# Patient Record
Sex: Female | Born: 1975
Health system: Southern US, Community
[De-identification: ages and names within clinical notes are randomized; demographics above are authoritative.]

## PROBLEM LIST (undated history)

## (undated) DIAGNOSIS — O24419 Gestational diabetes mellitus in pregnancy, unspecified control: Secondary | ICD-10-CM

## (undated) DIAGNOSIS — E669 Obesity, unspecified: Secondary | ICD-10-CM

## (undated) DIAGNOSIS — Z8632 Personal history of gestational diabetes: Secondary | ICD-10-CM

## (undated) HISTORY — PX: BACK SURGERY: SHX140

## (undated) HISTORY — PX: NO PAST SURGERIES: SHX2092

## (undated) HISTORY — DX: Obesity, unspecified: E66.9

## (undated) HISTORY — DX: Gestational diabetes mellitus in pregnancy, unspecified control: O24.419

## (undated) HISTORY — DX: Personal history of gestational diabetes: Z86.32

## (undated) NOTE — *Deleted (*Deleted)
Pt presents with cough and shortness of shortness of breath x 13 days. Pt repots tested positive for COVID on 10/03/20. Pt denies chest pain

---

## 1999-04-29 ENCOUNTER — Emergency Department (HOSPITAL_COMMUNITY): Admission: EM | Admit: 1999-04-29 | Discharge: 1999-04-30 | Payer: Self-pay | Admitting: Emergency Medicine

## 1999-04-29 ENCOUNTER — Encounter: Payer: Self-pay | Admitting: Emergency Medicine

## 2000-08-17 ENCOUNTER — Ambulatory Visit (HOSPITAL_COMMUNITY): Admission: RE | Admit: 2000-08-17 | Discharge: 2000-08-17 | Payer: Self-pay | Admitting: *Deleted

## 2000-11-24 ENCOUNTER — Inpatient Hospital Stay (HOSPITAL_COMMUNITY): Admission: RE | Admit: 2000-11-24 | Discharge: 2000-11-24 | Payer: Self-pay | Admitting: Obstetrics & Gynecology

## 2000-12-01 ENCOUNTER — Encounter (HOSPITAL_COMMUNITY): Admission: RE | Admit: 2000-12-01 | Discharge: 2001-03-01 | Payer: Self-pay | Admitting: *Deleted

## 2001-02-22 ENCOUNTER — Inpatient Hospital Stay (HOSPITAL_COMMUNITY): Admission: AD | Admit: 2001-02-22 | Discharge: 2001-02-22 | Payer: Self-pay | Admitting: Obstetrics & Gynecology

## 2006-10-31 ENCOUNTER — Inpatient Hospital Stay (HOSPITAL_COMMUNITY): Admission: AD | Admit: 2006-10-31 | Discharge: 2006-10-31 | Payer: Self-pay | Admitting: Obstetrics

## 2007-04-22 ENCOUNTER — Inpatient Hospital Stay (HOSPITAL_COMMUNITY): Admission: AD | Admit: 2007-04-22 | Discharge: 2007-04-24 | Payer: Self-pay | Admitting: Obstetrics

## 2008-05-02 ENCOUNTER — Encounter (INDEPENDENT_AMBULATORY_CARE_PROVIDER_SITE_OTHER): Payer: Self-pay | Admitting: Family Medicine

## 2008-05-02 ENCOUNTER — Ambulatory Visit: Payer: Self-pay | Admitting: Internal Medicine

## 2008-05-02 LAB — CONVERTED CEMR LAB
ALT: 40 units/L — ABNORMAL HIGH (ref 0–35)
AST: 35 units/L (ref 0–37)
Albumin: 4.1 g/dL (ref 3.5–5.2)
Alkaline Phosphatase: 69 units/L (ref 39–117)
BUN: 9 mg/dL (ref 6–23)
Basophils Absolute: 0 10*3/uL (ref 0.0–0.1)
Basophils Relative: 0 % (ref 0–1)
CO2: 22 meq/L (ref 19–32)
Calcium: 8.8 mg/dL (ref 8.4–10.5)
Chloride: 106 meq/L (ref 96–112)
Cholesterol: 153 mg/dL (ref 0–200)
Creatinine, Ser: 0.39 mg/dL — ABNORMAL LOW (ref 0.40–1.20)
Eosinophils Absolute: 0.2 10*3/uL (ref 0.0–0.7)
Eosinophils Relative: 2 % (ref 0–5)
Glucose, Bld: 98 mg/dL (ref 70–99)
HCT: 39.1 % (ref 36.0–46.0)
HDL: 59 mg/dL (ref >39)
Hemoglobin: 12.9 g/dL (ref 12.0–15.0)
LDL Cholesterol: 67 mg/dL (ref 0–99)
Lymphocytes Relative: 36 % (ref 12–46)
Lymphs Abs: 2.8 10*3/uL (ref 0.7–4.0)
MCHC: 33 g/dL (ref 30.0–36.0)
MCV: 90.3 fL (ref 78.0–100.0)
Monocytes Absolute: 0.6 10*3/uL (ref 0.1–1.0)
Monocytes Relative: 8 % (ref 3–12)
Neutro Abs: 4.3 10*3/uL (ref 1.7–7.7)
Neutrophils Relative %: 54 % (ref 43–77)
Platelets: 244 10*3/uL (ref 150–400)
Potassium: 3.9 meq/L (ref 3.5–5.3)
RBC: 4.33 M/uL (ref 3.87–5.11)
RDW: 13.4 % (ref 11.5–15.5)
Sodium: 138 meq/L (ref 135–145)
Total Bilirubin: 0.3 mg/dL (ref 0.3–1.2)
Total CHOL/HDL Ratio: 2.6
Total Protein: 7.4 g/dL (ref 6.0–8.3)
Triglycerides: 136 mg/dL (ref <150)
VLDL: 27 mg/dL (ref 0–40)
WBC: 7.9 10*3/uL (ref 4.0–10.5)

## 2008-09-11 ENCOUNTER — Ambulatory Visit: Payer: Self-pay | Admitting: Internal Medicine

## 2008-10-30 ENCOUNTER — Inpatient Hospital Stay (HOSPITAL_COMMUNITY): Admission: AD | Admit: 2008-10-30 | Discharge: 2008-11-01 | Payer: Self-pay | Admitting: Obstetrics

## 2009-02-09 ENCOUNTER — Encounter (INDEPENDENT_AMBULATORY_CARE_PROVIDER_SITE_OTHER): Payer: Self-pay | Admitting: Adult Health

## 2009-02-09 LAB — CONVERTED CEMR LAB
ALT: 31 units/L (ref 0–35)
Basophils Relative: 0 % (ref 0–1)
CO2: 19 meq/L (ref 19–32)
Chloride: 105 meq/L (ref 96–112)
Cholesterol: 145 mg/dL (ref 0–200)
GC Probe Amp, Genital: NEGATIVE
Hemoglobin: 13 g/dL (ref 12.0–15.0)
LDL Cholesterol: 77 mg/dL (ref 0–99)
Lymphocytes Relative: 43 % (ref 12–46)
Lymphs Abs: 4.1 10*3/uL — ABNORMAL HIGH (ref 0.7–4.0)
Monocytes Relative: 6 % (ref 3–12)
Neutro Abs: 4.5 10*3/uL (ref 1.7–7.7)
Neutrophils Relative %: 48 % (ref 43–77)
Potassium: 4 meq/L (ref 3.5–5.3)
Preg, Serum: NEGATIVE
RBC: 4.38 M/uL (ref 3.87–5.11)
Sodium: 141 meq/L (ref 135–145)
Total Bilirubin: 0.3 mg/dL (ref 0.3–1.2)
Total Protein: 7.3 g/dL (ref 6.0–8.3)
VLDL: 24 mg/dL (ref 0–40)
WBC: 9.5 10*3/uL (ref 4.0–10.5)

## 2009-05-01 ENCOUNTER — Ambulatory Visit: Payer: Self-pay | Admitting: Internal Medicine

## 2009-07-23 ENCOUNTER — Ambulatory Visit: Payer: Self-pay | Admitting: Internal Medicine

## 2009-10-14 ENCOUNTER — Ambulatory Visit: Payer: Self-pay | Admitting: Internal Medicine

## 2010-01-04 ENCOUNTER — Ambulatory Visit: Payer: Self-pay | Admitting: Internal Medicine

## 2011-04-19 NOTE — H&P (Signed)
NAMEMarland Kitchen  ANSLIE, SPADAFORA NO.:  [id number]   MEDICAL RECORD NO.:  [id number]          PATIENT TYPE:  INP   LOCATION:  9162                          FACILITY:  [facility]   PHYSICIAN:  Roseanna Rainbow, M.D.DATE OF BIRTH:  [birth year]   DATE OF ADMISSION:  04/22/2007  DATE OF DISCHARGE:                              HISTORY & PHYSICAL   CHIEF COMPLAINT:  The patient is a 35 year old para 2 with an estimated  date of confinement of Apr 20, 2007, with an intrauterine pregnancy at  40+ weeks complaining of uterine contractions.   HISTORY AND PHYSICAL:  Please see the above.   SOCIAL HISTORY:  She is single.  Denies any tobacco, ethanol, or drug  use.   ALLERGIES:  NO KNOWN DRUG ALLERGIES.   PAST GYNECOLOGIC HISTORY:  Noncontributory.   PAST OBSTETRICAL HISTORY:  In 1997 she was delivered of a liveborn female  7 pounds, full term, spontaneous vaginal delivery, no complications.  In  2001 she was delivered of a 9-pound female, full term, spontaneous  vaginal delivery, no complications.   PAST MEDICAL HISTORY:  She denies past surgical history.   ANTEPARTUM COURSE:  Prenatal care with Dr. Gaynell Face with onset of care  at 16 weeks.   OBSTETRIC RISK FACTORS:  History of a previous large-for-gestational-age  infant.  Placenta previa that resolved.  Urinary tract infection.   PRENATAL SCREENS:  Hemoglobin 13, hematocrit 38, platelets 244,000,  blood type O positive, antibody screen negative, RPR nonreactive,  rubella immune, hepatitis B surface antigen negative, HIV testing  declined, PPD  negative.  Pap smear test negative, GC probe negative,  Chlamydia probe negative.  Quad screen declined.  Three-hour GTT normal.  GBS negative on March 30, 2007.  Ultrasound demonstrated a complete  previa with marginal abruption on October 31, 2006, and that ultrasound  gave her an [facility] of Apr 20, 2007.  Ultrasound for placentation at 35  weeks 3 days demonstrated a posterior  placenta.   PHYSICAL EXAMINATION:  VITAL SIGNS:  Stable, afebrile.  GENERAL:  Well developed, well nourished, no apparent distress.  ABDOMEN:  Gravid.  STERILE VAGINAL EXAM:  The cervix is 6-7 cm dilated, the attitude is  left occiput anterior, no molding or caput, vertex is at a -1 station,  bulging bag of water was ruptured for clear fluid.  Fetal heart tracing  reassuring.   TOCODYNAMOMETER:  Uterine contractions every 2-5 minutes.   ASSESSMENT:  Multipara at term, active labor, fetal heart tracing  consistent with fetal well being.   PLAN:  Admission.  Expectant management.      Roseanna Rainbow, M.D.  Electronically Signed     LAJ/MEDQ  D:  04/22/2007  T:  04/22/2007  Job:  [id number]

## 2011-09-06 LAB — CBC
HCT: 34.7 — ABNORMAL LOW
Hemoglobin: 12
MCHC: 34.6
RDW: 13.5

## 2011-09-12 ENCOUNTER — Other Ambulatory Visit: Payer: Self-pay | Admitting: Family Medicine

## 2011-09-12 DIAGNOSIS — Z3689 Encounter for other specified antenatal screening: Secondary | ICD-10-CM

## 2011-09-12 LAB — GC/CHLAMYDIA PROBE AMP, GENITAL: Gonorrhea: NEGATIVE

## 2011-09-12 LAB — ABO/RH: RH Type: POSITIVE

## 2011-09-12 LAB — CBC
HCT: 37 % (ref 36–46)
Hemoglobin: 12.7 g/dL (ref 12.0–16.0)
Platelets: 184 10*3/uL (ref 150–399)
Platelets: 184 10*3/uL (ref 150–399)

## 2011-09-12 LAB — HEPATITIS B SURFACE ANTIGEN: Hepatitis B Surface Ag: NEGATIVE

## 2011-09-12 LAB — HIV ANTIBODY (ROUTINE TESTING W REFLEX): HIV: NONREACTIVE

## 2011-09-12 LAB — RUBELLA ANTIBODY, IGM: Rubella: IMMUNE

## 2011-09-12 LAB — RPR: RPR: NONREACTIVE

## 2011-09-22 ENCOUNTER — Ambulatory Visit (HOSPITAL_COMMUNITY)
Admission: RE | Admit: 2011-09-22 | Discharge: 2011-09-22 | Disposition: A | Discharge status: Home / Self Care | Payer: Medicaid Other | Source: Ambulatory Visit | Attending: Family Medicine | Admitting: Family Medicine

## 2011-09-22 DIAGNOSIS — Z1389 Encounter for screening for other disorder: Secondary | ICD-10-CM | POA: Insufficient documentation

## 2011-09-22 DIAGNOSIS — Z3689 Encounter for other specified antenatal screening: Secondary | ICD-10-CM

## 2011-09-22 DIAGNOSIS — E669 Obesity, unspecified: Secondary | ICD-10-CM | POA: Insufficient documentation

## 2011-09-22 DIAGNOSIS — O358XX0 Maternal care for other (suspected) fetal abnormality and damage, not applicable or unspecified: Secondary | ICD-10-CM | POA: Insufficient documentation

## 2011-09-22 DIAGNOSIS — Z363 Encounter for antenatal screening for malformations: Secondary | ICD-10-CM | POA: Insufficient documentation

## 2011-09-23 ENCOUNTER — Other Ambulatory Visit: Payer: Self-pay | Admitting: Family Medicine

## 2011-09-23 DIAGNOSIS — Z3689 Encounter for other specified antenatal screening: Secondary | ICD-10-CM

## 2011-10-06 ENCOUNTER — Ambulatory Visit (HOSPITAL_COMMUNITY): Payer: Self-pay

## 2011-10-06 ENCOUNTER — Ambulatory Visit (HOSPITAL_COMMUNITY)
Admission: RE | Admit: 2011-10-06 | Discharge: 2011-10-06 | Disposition: A | Discharge status: Home / Self Care | Payer: Medicaid Other | Source: Ambulatory Visit | Attending: Family Medicine | Admitting: Family Medicine

## 2011-10-06 DIAGNOSIS — O99210 Obesity complicating pregnancy, unspecified trimester: Secondary | ICD-10-CM | POA: Insufficient documentation

## 2011-10-06 DIAGNOSIS — Z3689 Encounter for other specified antenatal screening: Secondary | ICD-10-CM

## 2011-10-06 DIAGNOSIS — E669 Obesity, unspecified: Secondary | ICD-10-CM | POA: Insufficient documentation

## 2011-12-06 NOTE — L&D Delivery Note (Signed)
Delivery Note At 3:56 AM a viable female was delivered via Vaginal, Spontaneous Delivery (Presentation: ; Occiput Anterior).  APGAR: 9, 9; weight .   Placenta status: Intact, Spontaneous.  Cord: 3 vessels with the following complications: None.    Anesthesia: None  Episiotomy:  Lacerations:  Suture Repair: none Est. Blood Loss (mL): 350   Mom to postpartum.  Baby to nursery-stable.  CRESENZO-DISHMAN,Tracyann Duffell 02/23/2012, 4:15 AM

## 2011-12-19 LAB — CBC: Hemoglobin: 12.1 g/dL (ref 12.0–16.0)

## 2011-12-19 LAB — HIV ANTIBODY (ROUTINE TESTING W REFLEX): HIV: NONREACTIVE

## 2011-12-19 LAB — GLUCOSE TOLERANCE, 1 HOUR: GTT, 1 hr: 153

## 2011-12-23 LAB — GLUCOSE TOLERANCE, 3 HOURS
Glucose, GTT - 2 Hour: 203 mg/dL — AB (ref ?–140)
Glucose, GTT - 3 Hour: 113 mg/dL (ref ?–140)
Glucose, GTT - Fasting: 83 mg/dL (ref 80–110)

## 2011-12-28 ENCOUNTER — Other Ambulatory Visit (HOSPITAL_COMMUNITY): Payer: Self-pay | Admitting: Physician Assistant

## 2011-12-28 DIAGNOSIS — O99210 Obesity complicating pregnancy, unspecified trimester: Secondary | ICD-10-CM

## 2011-12-28 DIAGNOSIS — Z3689 Encounter for other specified antenatal screening: Secondary | ICD-10-CM

## 2011-12-29 ENCOUNTER — Ambulatory Visit (HOSPITAL_COMMUNITY)
Admission: RE | Admit: 2011-12-29 | Discharge: 2011-12-29 | Disposition: A | Discharge status: Home / Self Care | Payer: Medicaid Other | Source: Ambulatory Visit | Attending: Physician Assistant | Admitting: Physician Assistant

## 2011-12-29 DIAGNOSIS — O99810 Abnormal glucose complicating pregnancy: Secondary | ICD-10-CM | POA: Insufficient documentation

## 2011-12-29 DIAGNOSIS — Z3689 Encounter for other specified antenatal screening: Secondary | ICD-10-CM | POA: Insufficient documentation

## 2011-12-29 DIAGNOSIS — O99210 Obesity complicating pregnancy, unspecified trimester: Secondary | ICD-10-CM

## 2012-01-02 ENCOUNTER — Ambulatory Visit (INDEPENDENT_AMBULATORY_CARE_PROVIDER_SITE_OTHER): Payer: Self-pay | Admitting: Family Medicine

## 2012-01-02 ENCOUNTER — Encounter: Payer: Self-pay | Attending: Obstetrics and Gynecology | Admitting: Dietician

## 2012-01-02 DIAGNOSIS — O0990 Supervision of high risk pregnancy, unspecified, unspecified trimester: Secondary | ICD-10-CM

## 2012-01-02 DIAGNOSIS — E669 Obesity, unspecified: Secondary | ICD-10-CM

## 2012-01-02 DIAGNOSIS — O99810 Abnormal glucose complicating pregnancy: Secondary | ICD-10-CM

## 2012-01-02 DIAGNOSIS — O24419 Gestational diabetes mellitus in pregnancy, unspecified control: Secondary | ICD-10-CM | POA: Insufficient documentation

## 2012-01-02 DIAGNOSIS — Z713 Dietary counseling and surveillance: Secondary | ICD-10-CM | POA: Insufficient documentation

## 2012-01-02 DIAGNOSIS — O99210 Obesity complicating pregnancy, unspecified trimester: Secondary | ICD-10-CM

## 2012-01-02 LAB — POCT URINALYSIS DIP (DEVICE)
Glucose, UA: NEGATIVE mg/dL
Leukocytes, UA: NEGATIVE
Nitrite: NEGATIVE
pH: 6 (ref 5.0–8.0)

## 2012-01-02 NOTE — Progress Notes (Signed)
Diabetes Education:  Completed review of the diet and provided meter instructions, with the assistance of Spanish interpreter, Raynelle Fanning.  Provided diet and GDM teaching materials in Spanish.  Nutricion Diabetes y [place],, Conteo de carbos y planificaci8on de comidas. Provided True Track Meter, Lot: [id number] Expiration: 2013/05/04 Lancets (50) Lot: [id number] Expiration: 2016/03/05 Strips (50) Lot: [id number] EXpiration: 2014/04/02.  She completed a return demonstration of the meter.  Glucose fasting at 10:38 was 98 mg/dl.  She will check fasting and two hours post-first bite of each meal and record her glucose levels.  She was instructed to bring her meter and her glucose log to all clinic appointments.  Maggie Pauline Pegues, RN, RD, CDE

## 2012-01-02 NOTE — Progress Notes (Signed)
Some swelling in hands Used interpreter Delorise Royals.

## 2012-01-02 NOTE — Progress Notes (Signed)
Risks of Diabetes reviewed.  To see Diabetic educator today. U/S 1/25-frank breech, 4 lb 5 oz 88%, AC ahead, AFI-13.

## 2012-01-02 NOTE — Patient Instructions (Signed)
Diabetes mellitus gestacional (Gestational Diabetes Mellitus) La diabetes mellitus gestacional se produce slo durante el embarazo. Aparece cuando el organismo no puede controlar adecuadamente la glucosa (azcar) que aumenta en la sangre despus de comer. Durante el embarazo, se produce una resistencia a la insulina (sensibilidad reducida a la insulina) debido a la liberacin de hormonas por parte de la placenta. Generalmente, el pncreas de una mujer embarazada produce la cantidad suficiente de insulina para vencer esa resistencia. Sin embargo, en la diabetes gestacional, hay insulina pero no cumple su funcin adecuadamente. Si la resistencia es lo suficientemente grave como para que el pncreas no produzca la cantidad de insulina suficiente, la glucosa extra se acumula en la sangre.  QUINES TIENEN RIESGO DE DESARROLLAR DIABETES GESTACIONAL?  Las mujeres con historia de diabetes en la familia.   Las mujeres de ms de 25 aos.   Las que presentan sobrepeso.   Las mujeres que pertenecen a ciertos grupos tnicos (latinas, afroamericanas, norteamericanas nativas, asiticas y las originarias de las islas del [place].  QUE PUEDE OCURRIRLE AL BEB? Si el nivel de glucosa en sangre de la madre es demasiado elevado mientras este embarazada, el nivel extra de azcar pasar por el cordn umbilical hacia el beb. Algunos de los problemas del beb pueden ser:  Beb demasiado grande: si el nio recibe demasiada azcar, puede aumentar mucho de peso. Esto puede hacer que sea demasiado grande para nacer por parto normal (vaginal) por lo que ser necesario realizar una cesrea.   Bajo nivel de glucosa (hipoglucemia): el beb produce insulina extra en respuesta a la excesiva cantidad de azcar que obtiene de la madre. Cuando el beb nace y ya no necesita insulina extra, su nivel de azcar en sangre puede disminuir.   Ictericia (coloracin amarillenta de la piel y los ojos): esto es bastante frecuente en los  bebs. La causa es la acumulacin de una sustancia qumica denominada bilirrubina. No siempre es un trastorno grave, pero se observa con frecuencia en los bebs cuyas madres sufren diabetes gestacional.  RIESGOS PARA LA MADRE Las mujeres que han sufrido diabetes gestacional pueden tener ms riesgos para algunos problemas como:  Preeclampsia o toxemia, incluyendo problemas con hipertensin arterial. La presin arterial y los niveles de protenas en la orina deben controlarse con frecuencia.   Infecciones   Parto por cesrea.   Aparicin de diabetes tipo 2 en una etapa posterior de la vida. Alrededor del 30% al 50% sufrir diabetes posteriormente, especialmente las que son obesas.  DIAGNSTICO Las hormonas que causan resistencia a la insulina tienen su mayor nivel alrededor de las 24 a 28 semanas del embarazo. Si se experimentan sntomas, stos son similares a los sntomas que normalmente aparecen durante el embarazo.  La diabetes mellitus gestacional generalmente se diagnostica por medio de un mtodo en dos partes: 1. Despus de la 24 a 28 semanas de embarazo, la mujer debe beber una solucin que contiene glucosa y realizar un anlisis de sangre. Si el nivel de glucosa es elevado, la realizarn un segundo anlisis.  2. La prueba oral de tolerancia a la glucosa, que dura aproximadamente tres horas. Despus de realizar ayuno durante la noche, se controla nivel de glucosa en sangre. La mujer bebe una solucin que contiene glucosa y le realizan anlisis de glucosa en sangre cada hora.  Si la mujer tiene factores de riesgos para la diabetes mellitus gestacional, el mdico podr indicar el anlisis antes de las 24 semanas de embarazo. TRATAMIENTO El tratamiento est dirigido a mantener la glucosa en   sangre de la madre en un nivel normal y puede incluir:  La planificacin de los alimentos.   Recibir insulina u otro medicamento para controlar el nivel de glucosa en sangre.   La prctica de ejercicios.    Llevar un registro diario de los alimentos que consume.   Control y registro de los niveles de glucosa en sangre.   Control de los niveles de cetona en la orina, aunque esto ya no se considera necesario en la mayora de los embarazos.  INSTRUCCIONES PARA EL CUIDADO DOMICILIARIO Mientras est embarazada:  Siga los consejos de su mdico relacionados con los controles prenatales, la planificacin de la comida, la actividad fsica, los medicamentos, vitaminas, los anlisis de sangre y otras pruebas y las actividades fsicas.   Lleve un registro de las comidas, las pruebas de glucosa en sangre y la cantidad de insulina que recibe (si corresponde). Muestre todo al profesional en cada consulta mdica prenatal.   Si sufre diabetes mellitus gestacional, podr tener problemas de hipoglucemia (nivel bajo de glucosa en sangre). Podr sospechar este problema si se siente repentinamente mareada, tiene temblores y/o se siente dbil. Si cree que esto le est ocurriendo, y tiene un medidor de glucosa, mida su nivel de glucosa en sangre. Siga los consejos de su mdico sobre el modo y el momento de tratar su nivel de glucosa en sangre. Generalmente se sigue la regla 15:15 Consuma 15 g de hidratos de carbono, espere 15 minutos y vuelva controlar el nivel de glucosa en sangre.. Ejemplos de 15 g de hidratos de carbono son:   1 taza de leche descremada.    taza de jugo.   3-4 tabletas de glucosa.   5-6 caramelos duros.   1 caja pequea de pasas de uva.    taza de gaseosa comn.   Mantenga una buena higiene para evitar infecciones.   No fume.  SOLICITE ATENCIN MDICA SI:  Observa prdida vaginal con o sin picazn.   Se siente ms dbil o cansada que lo habitual.   Transpira mucho.   Tiene un aumento de peso repentino, 2,5 kg o ms en una semana.   Pierde peso, 1.5 kg o ms en una semana.   Su nivel de glucosa en sangre es elevado, necesita instrucciones.  SOLICITE ATENCIN MDICA DE  INMEDIATO SI:  Sufre una cefalea intensa.   Se marea o pierde el conocimiento   Presenta nuseas o vmitos.   Se siente desorientada confundida.   Sufre convulsiones.   Tiene problemas de visin.   Siente dolor en el estmago.   Presenta una hemorragia vaginal abundante.   Tiene contracciones uterinas.   Tiene una prdida importante de lquido por la vagina  DESPUS QUE NACE EL BEB:  Concurra a todos los controles de seguimiento y realice los anlisis de sangre segn las indicaciones de su mdico.   Mantenga un estilo de vida saludable para evitar la diabetes en el futuro. Aqu se incluye:   Siga el plan de alimentacin saludable.   Controle su peso.   Practique actividad fsica y descanse lo necesario.   No fume.   Amamante a su beb mientras pueda. Esto disminuir la probabilidad de que usted y su beb sufran diabetes posteriormente.  Para ms informacin acerca de la diabetes, visite la pgina web de la American Diabetes Association: www.americandiabetesassociation.org. Para ms informacin acerca de la diabetes gestacional cite la pgina web del American [employer]nec[employer] en: www.acog.org. Document Released: 08/31/2005 Document Revised: 08/03/2011 [employer] Patient Information 2012   [employer]. Embarazo - Tercer trimestre (Pregnancy - Third Trimester) El tercer trimestre del embarazo (los ltimos 3 meses) es el perodo de cambios ms rpidos que atraviesan usted y el beb. El aumento de peso es ms rpido. El beb alcanza un largo de aproximadamente 50 cm (20 pulgadas) y pesa entre 2,700 y 4,500 kg (6 a 10 libras). El beb gana ms tejido graso y ya est listo para la vida fuera del cuerpo de la madre. Mientras estn en el interior, los bebs tienen perodos de sueo y vigilia, succionan el pulgar y tienen hipo. Quizs sienta pequeas contracciones del tero. Este es el falso trabajo de parto. Tambin se las conoce como contracciones de  Braxton-Hicks. Es como una prctica del parto. Los problemas ms habituales de esta etapa del embarazo incluyen mayor dificultad para respirar, hinchazn de las manos y los pies por retencin de lquidos y la necesidad de orinar con ms frecuencia debido a que el tero y el beb presionan sobre la vejiga.  EXAMENES PRENATALES  Durante los exmenes prenatales, deber seguir realizando pruebas de sangre, segn avance el embarazo. Estas pruebas se realizan para controlar su salud y la del beb. Tambin se realizan anlisis de sangre para conocer los niveles de hemoglobina. La anemia (bajo nivel de hemoglobina) es frecuente durante el embarazo. Para prevenirla, se administran hierro y vitaminas. Tambin le harn nuevas pruebas para descartar la diabetes. Podrn repetirle algunas de las pruebas que le hicieron previamente.   En cada visita le medirn el tamao del tero. Es para asegurarse de que el beb se desarrolla correctamente.   Tambin en cada visita la pesarn. Esto se realiza para asegurarse de que aumenta de peso al ritmo indicado y que usted y su beb evolucionan normalmente.   En algunas ocasiones se realiza una ecografa para confirmar el correcto desarrollo y evolucin del beb. Esta prueba se realiza con ondas sonoras inofensivas para el beb, de modo que el profesional pueda calcular con ms precisin la fecha del parto.   Discuta las posibilidades de la anestesia si necesita cesrea.  Algunas veces se realizan pruebas especializadas del lquido amnitico que rodea al beb. Esta prueba se denomina amniocentesis. El lquido amnitico se obtiene introduciendo una aguja en el abdomen (vientre). En ocasiones se lleva a cabo cerca del final del embarazo, si es necesario adelantar el parto. En este caso se realiza para asegurarse de que los pulmones del beb estn lo suficientemente maduros como para que pueda vivir fuera del tero. CAMBIOS QUE OCURREN EN EL TERCER TRIMESTRE DEL EMBARAZO Su  organismo atravesar diferentes cambios durante el embarazo que varan de una persona a otra. Converse con el profesional que la asiste acerca los cambios que usted note y que la preocupen.  Durante el ltimo trimestre probablemente sienta un aumento del apetito. Es normal tener "antojos" de ciertas comidas. Esto vara de una persona a otra y de un embarazo a otro.   Podrn aparecer las primeras estras en las caderas, abdomen y mamas. Estos son cambios normales del cuerpo durante el embarazo. No existen medicamentos ni ejercicios que puedan prevenir estos cambios.   El estreimiento puede tratarse con un laxante o agregando fibra a su dieta. Beber grandes cantidades de lquidos, tomar fibras en forma de verduras, frutas y granos integrales es de gran ayuda.   Tambin es beneficioso practicar actividad fsica. Si ha sido una persona activa hasta el embarazo, podr continuar con la mayora de las actividades durante el mismo. Si ha sido menos activa,   puede ser beneficioso que comience con un programa de ejercicios, como realizar caminatas. Consulte con el profesional que la asiste antes de comenzar un programa de ejercicios.   Evite el consumo de cigarrillos, el alcohol, los medicamentos no prescritos y las "drogas de la calle" durante el embarazo. Estas sustancias qumicas afectan la formacin y el desarrollo del beb. Evite estas sustancias durante todo el embarazo para asegurar el nacimiento de un beb sano.   Dolor de espalda, venas varicosas y hemorroides podran aparecer o empeorar.   Los movimientos del beb pueden ser ms bruscos y aparecer ms a menudo.   Puede que note dificultades para respirar facilmente.   El ombligo podra salrsele hacia afuera.   Puede segregar un lquido amarillento (calostro) de las mamas.   Puede segregar mucus con sangre. Esto normalmente ocurre unos pocos das a una semana antes de que comience el trabajo de parto.  INSTRUCCIONES PARA EL CUIDADO  DOMICILIARIO  La mayor parte de los cuidados que se aconsejan son los mismos que los indicados para las primeras etapas del embarazo. Es importante que concurra a todas las citas con el profesional y siga sus instrucciones con respecto a los medicamentos que deba utilizar, a la actividad fsica y a la dieta.   Durante el embarazo debe obtener nutrientes para usted y para su beb. Consuma alimentos balanceados a intervalos regulares. Elija alimentos como carne, pescado, leche y otros productos lcteos descremados, verduras, frutas, panes integrales y cereales. El profesional le informar cul es el aumento de peso ideal.   Las relaciones sexuales pueden continuarse hasta casi el final del embarazo, si no se presentan otros problemas como prdida prematura (antes de tiempo) de lquido amnitico, hemorragia vaginal o dolor abdominal (en el vientre).   Realice actividad fsica todos los das, si no tiene restricciones. Consulte con el profesional que la asiste si no sabe con certeza si determinados ejercicios son seguros. El mayor aumento de peso se produce en los dos ltimos trimestres del embarazo.   Haga reposo con frecuencia, con las piernas elevadas, o segn lo necesite para evitar los calambres y el dolor de cintura.   Use un buen sostn o como los que se usan para hacer deportes para aliviar la sensibilidad de las mamas. Tambin puede serle til si lo usa mientras duerme. Si pierde calostro, podr utilizar apsitos en el sostn.   No utilice la baera con agua caliente, baos turcos y saunas.   Colquese el cinturn de seguridad cuando conduzca. Este la proteger a usted y al beb en caso de accidente.   Evite comer carne cruda y el contacto con los utensilios y desperdicios de los gatos. Estos elementos contienen grmenes que pueden causar defectos de nacimiento en el beb.   Es fcil perder algo de orina durante el embarazo. Apretar y fortalecer los msculos de la pelvis la ayudar con este  problema. Practique detener la miccin cuando est en el bao. Estos son los mismos msculos que necesita fortalecer. Son tambin los mismos msculos que utiliza cuando trata de evitar los gases. Puede practicar apretando estos msculos diez veces, y repetir esto tres veces por da aproximadamente. Una vez que conozca qu msculos debe contraer, no realice estos ejercicios durante la miccin. Puede favorecerle una infeccin si la orina vuelve hacia atrs.   Pida ayuda si tiene necesidades econmicas, de asesoramiento o nutricionales durante el embarazo. El profesional podr ayudarla con respecto a estas necesidades, o derivarla a otros especialistas.   Practique la ida hasta el   hospital a modo de prueba.   Tome clases prenatales junto con su pareja para comprender, practicar y hacer preguntas acerca del trabajo de parto y el nacimiento.   Prepare la habitacin del beb.   No viaje fuera de la ciudad a menos que sea absolutamente necesario y con el consejo del mdico.   Use slo zapatos bajos sin taco para tener un mejor equilibrio y prevenir cadas.  EL CONSUMO DE MEDICAMENTOS Y DROGAS DURANTE EL EMBARAZO  Contine tomando las vitaminas apropiadas para esta etapa tal como se le indic. Las vitaminas deben contener un miligramo de cido flico y deben suplementarse con hierro. Guarde todas las vitaminas fuera del alcance de los nios. La ingestin de slo un par de vitaminas o comprimidos que contengan hierro pueden ocasionar la muerte en un beb o en un nio pequeo.   Evite el uso de medicamentos, inclusive los de venta libre, que no hayan sido prescritos o indicados por el profesional que la asiste. Algunos medicamentos pueden causar problemas fsicos al beb. Utilice los medicamentos de venta libre o de prescripcin para el dolor, el malestar o la fiebre, segn se lo indique el profesional que lo asiste. No utilice aspirina, ibuprofeno (Motrin, Advil, Nuprin) o naproxeno (Aleve) a menos que  el profesional la autorice.   El alcohol se asocia a cierto nmero de defectos del nacimiento, incluido el sndrome de alcoholismo fetal. Debe evitar el consumo de alcohol en cualquiera de sus formas. El cigarrillo causa nacimientos prematuros y bebs de bajo peso al nacer. Las drogas de la calle son muy nocivas para el beb y estn absolutamente prohibidas. Un beb que nace de una madre adicta, ser adicto al nacer. Ese beb tendr los mismos sntomas de abstinencia que un adulto.   Infrmele al profesional si consume alguna droga.  SOLICITE ATENCIN MDICA SI: Tiene alguna preocupacin durante el embarazo. Es mejor que llame para formular las preguntas si no puede esperar hasta la prxima visita, que sentirse preocupada por ellas.  DECISIONES ACERCA DE LA CIRCUNCISIN Usted puede saber o no cul es el sexo de su beb. Si es un varn, ste es el momento de pensar acerca de la circuncisin. La circuncisin es la extirpacin del prepucio. Esta es la piel que cubre el extremo sensible del pene. No hay un motivo mdico que lo justifique. Generalmente la decisin se toma segn lo que sea popular en ese momento, o se basa en creencias religiosas. Podr conversar estos temas con el profesional que la asiste. SOLICITE ATENCIN MDICA DE INMEDIATO SI:  La temperatura oral se eleva sin motivo por encima de 102 F (38.9 C) o segn le indique el profesional que la asiste.   Tiene una prdida de lquido por la vagina (canal de parto). Si sospecha una ruptura de las membranas, tmese la temperatura y llame al profesional para informarlo sobre esto.   Observa unas pequeas manchas, una hemorragia vaginal o elimina cogulos. Avsele al profesional acerca de la cantidad y de cuntos apsitos est utilizando.   Presenta un olor desagradable en la secrecin vaginal y observa un cambio en el color, de transparente a blanco.   Ha vomitado durante ms de 24 horas.   Presenta escalofros o fiebre.   Comienza a  sentir falta de aire.   Siente ardor al orinar.   Baja o sube ms de 900 g (ms de 2 libras), o segn lo indicado por el profesional que la asiste. Observa que sbitamente se le hinchan el rostro, las manos, los   pies o las piernas.   Presenta dolor abdominal. Las molestias en el ligamento redondo son una causa benigna (no cancerosa) frecuente de dolor abdominal durante el embarazo, pero el profesional que la asiste deber evaluarlo.   Presenta dolor de cabeza intenso que no se alivia.   Si no siente los movimientos del beb durante ms de tres horas. Si piensa que el beb no se mueve tanto como lo haca habitualmente, coma algo que contenga azcar y recustese sobre el lado izquierdo durante una hora. El beb debe moverse al menos 4  5 veces por hora. Comunquese inmediatamente si el beb se mueve menos que lo indicado.   Se cae, se ve involucrada en un accidente automovilstico o sufre algn tipo de traumatismo.   En su hogar hay violencia mental o fsica.  Document Released: 08/31/2005 Document Revised: 08/03/2011 [employer] Patient Information 2012 [employer]. Eleccin del mtodo anticonceptivo (Birth Control Choices) Los anticonceptivos son mtodos, prcticas o dispositivos para evitar que se produzca el embarazo en una mujer sexualmente activa.  A continuacin se indican algunos mtodos para evitar el embarazo.  No tener relaciones sexuales (abstinencia) es el mtodo ms seguro para el control de la natalidad. Requiere autocontrol. No hay riesgo de contraer enfermedades de transmisin sexual ni el sndrome de inmunodeficiencia adquirida (SIDA).   Abstinencia peridica requiere autocontrol en ciertos perodos del mes.   Mtodo calendario, teniendo en cuenta el momento de sus perodos menstruales todos los meses.   El mtodo de ovulacin es evitar tener relaciones sexuales en la poca en la que est ovulando (formando un vulo).   El mtodo simptotrmico es evitar tener  relaciones sexuales en la poca en la que est ovulando con la utilizacin de un termmetro y los sntomas de la ovulacin.   El mtodo de postovulacin es tener relaciones sexuales despus de la ovulacin.  Estos mtodos no protegen contra las infecciones transmitidas sexualmente ni contra el SIDA.  Las pldoras anticonceptivas contienen estrgenos y progesterona. Estos medicamentos actan impidiendo la ovulacin (la liberacin del huevo del ovario). El mdico prescribir pldoras anticonceptivas, y le har preguntas acerca de los riesgos de tomarlas. Las pldoras anticonceptivas no protegen contra las infecciones transmitidas sexualmente ni contra el SIDA.   La "minipldora" slo contiene progesterona. Deben tomarse todos los das del mes y debe prescribirlas el mdico. Estos mtodos no protegen contra las infecciones transmitidas sexualmente ni contra el SIDA.   Los anticonceptivos de emergencia, tambin llamados "la pldora del da despus" La pldora puede tomarse inmediatamente despus de tener relaciones sexuales o hasta cinco das despus, si piensa que su mtodo anticonceptivo ha fallado, o fue forzada a tener sexo. Es ms efectiva si se toma poco tiempo despus. No use los anticonceptivos de emergencia como nico mtodo anticonceptivo. Los anticonceptivos de emergencia estn disponibles sin prescripcin mdica. Consltelo con su farmacutico.   Los condones son una vaina delgada de ltex, material sinttico o piel de cordero que se usan en el pene durante el acto sexual. Pueden tener un esperimicida incorporado. Los condones de ltex evitan el embarazo y las enfermedades de transmisin sexual. Los condones "naturales" o de piel de cordero evitan el embarazo pero no protegen contra las enfermedades de transmisin sexual ni el sida.   Los condones femeninos son una vaina blanda y que se adaptan suavemente a la vagina antes de las relaciones sexuales. Pueden evitar el embarazo y las enfermedades  de transmisin sexual, inclusive el sida,   La esponja es una pieza de poliuretano circular suave   con espermicida que se inserta en la vagina luego de humedecerla y antes de tener relaciones sexuales. No requiere prescripcin mdica. Estos mtodos no protegen contra las infecciones transmitidas sexualmente ni contra el SID.   El diafragma es una barrera de ltex redonda y suave que debe ser recomendado por un profesional. Se inserta en la vagina, junto con un gel espermicida. Luego de prepararlo, insrtelo antes de las relaciones sexuales. Debe dejar el diafragma colocado en la vagina durante 6 a 8 horas. La eliminacin y reinsercin siempre debe realizarse con un espermicida. Este mtodo no protege contra las infecciones transmitidas sexualmente ni contra el SIDA.   Las inyecciones de progesterona se administran cada 3 meses como mtodo anticonceptivo. Estas inyecciones contienen progesterona sinttica y no contienen estrgenos, Esta hormona impide que los ovarios liberen vulos. Tambin hacen que el moco cervical se espese y modifique el tejido de recubrimiento interno del tero. Esto hace ms difcil que los espermatozoides sobrevivan en el tero. No protege contra las infecciones transmitidas sexualmente o el sida.   Parche para el control de la natalidad contiene hormonas similares a las de las pldoras, de modo que la efectividad, los riesgos y los efectos secundarios son los mismos.Deben cambiarse una vez por semana y se utilizan bajo prescripcin mdica. Es menos efectivo en las mujeres con sobrepeso. No protege contra las infecciones transmitidas sexualmente ni el sida.   Anillo vaginal contiene hormonas similares a las que contienen las pldoras anticonceptivas. Se deja colocado durante tres semanas, se lo retira durante una semana y luego se coloca uno nuevo. Trae un timer para colocar en el bolsillo y recordar cundo debe retirarlo y colocarse uno nuevo. Es necesario un examen previo y la  prescripcin del mdico, al igual que con las pldoras y el parche. No protege contra las infecciones transmitidas sexualmente o el sida.   Las inyecciones de estrgeno ms progesterona se administran cada 28 a 30 das. Pueden aplicarse en el brazo, muslo o nalgas. No protege contra las infecciones transmitidas sexualmente ni contra el SIDA.   Dispositivo intrauterino (DIU): T de cobre o T con progesterona es un dispositivo con forma de T que se coloca en el tero de la mujer durante el perodo menstrual, para prevenir el embarazo. La T de cobre dura 10 aos y el dispositivo de progesterona puede durar 5 aos. El DIU de progesterona tambin puede ayudar a controlar los perodos menstruales abundantes. No protege contra las infecciones transmitidas sexualmente ni contra el SIDA. El DIU de T de cobre se puede utilizar como dispositivo de emergencia, si se inserta dentro de los 5 das de tener relaciones sexuales sin proteccin.   El capuchn cervical es una barrera de ltex o taza plstica redonda y suave que cubre el cuello del tero y debe ser colocada por un mdico. No necesitar utilizar un espermicida para colocarlo o retirarlo cada vez que tiene relaciones sexuales. No protege contra las infecciones transmitidas sexualmente ni contra el SIDA.   Los espermicidas son qumicos que matan o bloquean el esperma y no lo dejan ingresar al cuello del tero y al tero. Vienen en forma de cremas, geles, supositorios, espuma o pastillas, y no requieren prescripcin. Se insertan en la vagina con un aplicador antes de tener relaciones sexuales. Esto debe repetirse cada vez que tiene relaciones sexuales.   El retiro es un mtodo en el que el hombre retira el pene durante las relaciones sexuales antes de llegar al clmax y depositar el esperma. No protege contra las   infecciones transmitidas sexualmente ni contra el SIDA.   La ligadura de trompas en la mujer se realiza sellando quirrgicamente las trompas de [place]  lo que impide que el vulo descienda hacia el tero. No protege contra las infecciones transmitidas sexualmente ni contra el SIDA.   La esterilizacin masculina es cuando al hombre se le atan los conductos quirrgicamente (vasectoma) para que el esperma no ingrese a la vagina durante las relaciones sexuales. No protege contra las infecciones transmitidas sexualmente ni contra el SIDA.  Independientemente del mtodo anticonceptivo que usted elija, es importante que utilice alguna forma de proteccin contra las infecciones que se transmiten sexualmente. Document Released: 11/21/2005 Document Revised: 12/24/2010 Exit[employer] Patient Information 2012 [employer]. Amamantar al beb (Breastfeeding) LOS BENEFICIOS DE AMAMANTAR Para el beb  La primera leche (calostro ) ayuda al mejor funcionamiento del sistema digestivo del beb.   La leche tiene anticuerpos que provienen de la madre y que ayudan a prevenir las infecciones en el beb.   Hay una menor incidencia de asma, enfermedades alrgicas y SMSI (sndrome de muerte sbita nfantil).   Los nutrientes que contiene la leche materna son mejores que las frmulas para el bibern y favorecen el desarrollo cerebral.   Los bebs amamantados sufren menos gases, clicos y constipacin.  Para la mam  La lactancia materna favorece el desarrollo de un vnculo muy especial entre la madre y el beb.   Es ms conveniente, siempre disponible a la temperatura adecuada y ms econmica que la leche maternizada.   Consume caloras en la madre y la ayuda a perder el peso ganado durante el embarazo.   Favorece la contraccin del tero a su tamao normal, de manera ms rpida y disminuye las hemorragias luego del parto.   Las madres que amamantan tienen menor riesgo de desarrollar cncer de mama.  AMAMNTELO CON FRECUENCIA  Un beb sano, nacido a trmino, puede amamantarse con tanta frecuencia como cada hora, o espaciar las comidas cada tres horas.   Esta  frecuencia variar de un beb a otro. Observe al beb cuando manifieste signos de hambre, antes que regirse por el reloj.   Amamntelo tan seguido como el beb lo solicite, o cuando usted sienta la necesidad de aliviar sus mamas.   Despierte al beb si han pasado 3  4 horas desde la ltima comida.   El amamantamiento frecuente la ayudar a producir ms leche y a prevenir problemas de dolor en los pezones e hinchazn de las mamas.  LA POSICIN DEL BEB PARA AMAMANTARLO  Ya sea que se encuentre acostada o sentada, asegrese que el abdomen del beb enfrente el suyo.   Sostenga la mama con el pulgar por arriba y el resto de los dedos por debajo. Asegrese que sus dedos se encuentren lejos del pezn y de la boca del beb.   Toque suavemente los labios del beb y la mejilla ms cercana a la mama con el dedo o el pezn.   Cuando la boca del beb se abra lo suficiente, introduzca el pezn y la zona oscura que lo rodea tanto como le sea posible dentro de la boca.   Coloque a beb cerca suyo de modo que su nariz y mejillas toquen las mamas al mamar.  LAS COMIDAS  La duracin de cada comida vara de un beb a otro y de una comida a otra.   El beb debe succionar alrededor de dos o tres minutos para que le llegue leche. Esto se denomina "bajada". Por este motivo, permita que   el nio se alimente en cada mama todo lo que desee. Terminar de mamar cuando haya recibido la cantidad adecuada de nutrientes.   Para detener la succin coloque su dedo en la comisura de la boca del nio y deslcelo entre sus encas antes de quitarle la mama de la boca. Esto la ayudar a evitar el dolor en los pezones.  REDUCIR LA CONGESTIN DE LAS MAMAS  Durante la primera semana despus del parto, usted puede experimentar congestin en las mamas. Cuando las mamas estn congestionadas, se sienten calientes, llenas y molestas al tacto. Puede reducir la congestin si:   Lo amamanta frecuentemente, cada 2-3 horas. Las mams que  amamantan pronto y con frecuencia tienen menos problemas de congestin.   Coloque bolsas fras livianas entre cada mamada. Esto ayuda a reducir la hinchazn. Envuelva las bolsas de hielo en una toalla liviana para proteger su piel.   Aplique compresas hmedas calientes sobre la mama durante 5 a 10 minutos antes de amamantar al nio. Esto aumenta la circulacin y ayuda a que la leche fluya.   Masajee suavemente la mama antes y durante la alimentacin.   Asegrese que el nio vaca al menos una mama antes de cambiar de lado.   Use un sacaleche para vaciar la mama si el beb se duerme o no se alimenta bien. Tambin podr quitarse la leche con esta bomba si tiene que volver al trabajo o siente que las mamas estn congestionadas.   Evite los biberones, chupetes o complementar la alimentacin con agua o jugos en lugar de la leche materna.   Verifique que el beb se encuentra en la posicin correcta mientras lo alimenta.   Evite el cansancio, el estrs y la anemia   Use un soutien que sostenga bien sus mamas y evite los que tienen aro.   Consuma una dieta balanceada y beba lquidos en cantidad.  Si sigue estas indicaciones, la congestin debe mejorar en 24 a 48 horas. Si an tiene dificultades, consulte a su asesor en lactancia. TENDR SUFICIENTE LECHE MI BEB? Algunas veces las madres se preocupan acerca de si sus bebs tendrn la leche suficiente. Puede asegurarse que el beb tiene la leche suficiente si:  El beb succiona y escucha que traga activamente.   El nio se alimenta al menos 8 a 12 veces en 24 horas. Alimntelo hasta que se desprenda por sus propios medios o se quede dormido en la primera mama (al menos durante 10 a 20 minutos), luego ofrzcale el otro lado.   El beb moja 5 a 6 paales descartables (6 a 8 paales de tela) en 24 horas cuando tiene 5  6 das de vida.   Tiene al menos 2-3 deposiciones todos los das en los primeros meses. La leche materna es todo el alimento que  el beb necesita. No es necesario que el nio ingiera agua o preparados de bibern. De hecho, para ayudar a que sus mamas produzcan ms leche, lo mejor es no darle al beb suplementos durante las primeras semanas.   La materia fecal debe ser blanda y amarillenta.   El beb debe aumentar 112 a 196 g por semana.  CUDESE Cuide sus mamas del siguiente modo:  Bese o dchese diariamente.   No lave sus pezones con jabn.   Comience a amamantar del lado izquierdo en una comida y del lado derecho en la siguiente.   Notar que aumenta el suministro de leche a los 2 a 5 das despus del parto. Puede sentir algunas molestias por   la congestin, lo que hace que sus mamas estn duras y sensibles. La congestin disminuye en 24 a 48 horas. Mientras tanto, aplique toallas hmedas calientes durante 5 a 10 minutos antes de amamantar. Un masaje suave y la extraccin de un poco de leche antes de amamantar ablandarn las mamas y har ms fcil que el beb se agarre. Use un buen sostn y seque al aire los pezones durante 10 a 15 minutos luego de cada alimentacin.   Solo utilice apsitos de algodn.   Utilice lanolina pura sobre los pezones luego de amamantar. No necesita lavarlos luego de alimentar al nio.  Cudese del siguiente modo:   Consuma alimentos bien balanceados y refrigerios nutritivos.   Beba leche, jugos de fruta y agua para satisfacer la sed (alrededor de 8 vasos por da).   Descanse lo suficiente.   Aumente la ingesta de calcio en la dieta (1200mg/da).   Evite los alimentos que usted nota que puedan afectar al beb.  SOLICITE ATENCIN MDICA SI:  Tiene preguntas que formular o dificultades con la alimentacin a pecho.   Necesita ayuda.   Observa una zona dura, roja y que le duele en la zona de la mama, y se acompaa de fiebre de 100.5 F (38.1 C) o ms.   El beb est muy somnoliento como para alimentarse bien o tiene problemas para dormir.   El beb moja menos de 6 paales por  da, a partir de los 5 das de vida.   La piel del beb o la parte blanca de sus ojos est ms amarilla de lo que estaba en el hospital.   Se siente deprimida.  Document Released: 11/21/2005 Document Revised: 08/03/2011 Exit[employer] Patient Information 2012 [employer]. 

## 2012-01-09 ENCOUNTER — Ambulatory Visit (INDEPENDENT_AMBULATORY_CARE_PROVIDER_SITE_OTHER): Payer: Self-pay | Admitting: Obstetrics & Gynecology

## 2012-01-09 DIAGNOSIS — O0990 Supervision of high risk pregnancy, unspecified, unspecified trimester: Secondary | ICD-10-CM

## 2012-01-09 DIAGNOSIS — E669 Obesity, unspecified: Secondary | ICD-10-CM

## 2012-01-09 DIAGNOSIS — O24419 Gestational diabetes mellitus in pregnancy, unspecified control: Secondary | ICD-10-CM

## 2012-01-09 DIAGNOSIS — O99810 Abnormal glucose complicating pregnancy: Secondary | ICD-10-CM

## 2012-01-09 DIAGNOSIS — O99210 Obesity complicating pregnancy, unspecified trimester: Secondary | ICD-10-CM

## 2012-01-09 LAB — POCT URINALYSIS DIP (DEVICE)
Glucose, UA: NEGATIVE mg/dL
Nitrite: NEGATIVE
Urobilinogen, UA: 0.2 mg/dL (ref 0.0–1.0)

## 2012-01-09 NOTE — Patient Instructions (Signed)
Return to clinic for any obstetric concerns or go to MAU for evaluation  

## 2012-01-09 NOTE — Progress Notes (Signed)
Only two abnormal postprandials, emphasized diet adherence.  Growth scan 1/24 1961g/66%, AFI 13; next growth scan at 38 weeks. Interested in BTL but is self-pay, needs to talk with financial counselor about setting up payment plan.  Interested in breast and bottle feeing. No other complaints or concerns.  Fetal movement and labor precautions reviewed.  RTC in 2 weeks.

## 2012-01-09 NOTE — Progress Notes (Signed)
Pulse: 86

## 2012-01-13 ENCOUNTER — Telehealth: Payer: Self-pay | Admitting: *Deleted

## 2012-01-13 NOTE — Telephone Encounter (Signed)
Call received from Eda that pt had called and is in need of refill of diabetes testing supplies. I asked Eda to call pt back and tell her that she may pick up the supplies from our clinic today before 1200.

## 2012-01-23 ENCOUNTER — Ambulatory Visit (INDEPENDENT_AMBULATORY_CARE_PROVIDER_SITE_OTHER): Payer: Self-pay | Admitting: Obstetrics & Gynecology

## 2012-01-23 VITALS — BP 118/78 | HR 90 | Temp 98.2°F | Wt 267.2 lb

## 2012-01-23 DIAGNOSIS — O24419 Gestational diabetes mellitus in pregnancy, unspecified control: Secondary | ICD-10-CM

## 2012-01-23 DIAGNOSIS — O99810 Abnormal glucose complicating pregnancy: Secondary | ICD-10-CM

## 2012-01-23 LAB — POCT URINALYSIS DIP (DEVICE)
Glucose, UA: NEGATIVE mg/dL
Nitrite: NEGATIVE
Urobilinogen, UA: 0.2 mg/dL (ref 0.0–1.0)

## 2012-01-23 NOTE — Progress Notes (Signed)
Normal fasting BS, pp all normal Except rarely 121-123. Diet emphasized

## 2012-01-23 NOTE — Patient Instructions (Signed)
Diabetes mellitus gestacional (Gestational Diabetes Mellitus) La diabetes mellitus gestacional se produce slo durante el embarazo. Aparece cuando el organismo no puede controlar adecuadamente la glucosa (azcar) que aumenta en la sangre despus de comer. Durante el embarazo, se produce una resistencia a la insulina (sensibilidad reducida a la insulina) debido a la liberacin de hormonas por parte de la placenta. Generalmente, el pncreas de una mujer embarazada produce la cantidad suficiente de insulina para vencer esa resistencia. Sin embargo, en la diabetes gestacional, hay insulina pero no cumple su funcin adecuadamente. Si la resistencia es lo suficientemente grave como para que el pncreas no produzca la cantidad de insulina suficiente, la glucosa extra se acumula en la sangre.  QUINES TIENEN RIESGO DE DESARROLLAR DIABETES GESTACIONAL?  Las mujeres con historia de diabetes en la familia.   Las mujeres de ms de 25 aos.   Las que presentan sobrepeso.   Las mujeres que pertenecen a ciertos grupos tnicos (latinas, afroamericanas, norteamericanas nativas, asiticas y las originarias de las islas del [place].  QUE PUEDE OCURRIRLE AL BEB? Si el nivel de glucosa en sangre de la madre es demasiado elevado mientras este embarazada, el nivel extra de azcar pasar por el cordn umbilical hacia el beb. Algunos de los problemas del beb pueden ser:  Beb demasiado grande: si el nio recibe demasiada azcar, puede aumentar mucho de peso. Esto puede hacer que sea demasiado grande para nacer por parto normal (vaginal) por lo que ser necesario realizar una cesrea.   Bajo nivel de glucosa (hipoglucemia): el beb produce insulina extra en respuesta a la excesiva cantidad de azcar que obtiene de la madre. Cuando el beb nace y ya no necesita insulina extra, su nivel de azcar en sangre puede disminuir.   Ictericia (coloracin amarillenta de la piel y los ojos): esto es bastante frecuente en los  bebs. La causa es la acumulacin de una sustancia qumica denominada bilirrubina. No siempre es un trastorno grave, pero se observa con frecuencia en los bebs cuyas madres sufren diabetes gestacional.  RIESGOS PARA LA MADRE Las mujeres que han sufrido diabetes gestacional pueden tener ms riesgos para algunos problemas como:  Preeclampsia o toxemia, incluyendo problemas con hipertensin arterial. La presin arterial y los niveles de protenas en la orina deben controlarse con frecuencia.   Infecciones   Parto por cesrea.   Aparicin de diabetes tipo 2 en una etapa posterior de la vida. Alrededor del 30% al 50% sufrir diabetes posteriormente, especialmente las que son obesas.  DIAGNSTICO Las hormonas que causan resistencia a la insulina tienen su mayor nivel alrededor de las 24 a 28 semanas del embarazo. Si se experimentan sntomas, stos son similares a los sntomas que normalmente aparecen durante el embarazo.  La diabetes mellitus gestacional generalmente se diagnostica por medio de un mtodo en dos partes: 1. Despus de la 24 a 28 semanas de embarazo, la mujer debe beber una solucin que contiene glucosa y realizar un anlisis de sangre. Si el nivel de glucosa es elevado, la realizarn un segundo anlisis.  2. La prueba oral de tolerancia a la glucosa, que dura aproximadamente tres horas. Despus de realizar ayuno durante la noche, se controla nivel de glucosa en sangre. La mujer bebe una solucin que contiene glucosa y le realizan anlisis de glucosa en sangre cada hora.  Si la mujer tiene factores de riesgos para la diabetes mellitus gestacional, el mdico podr indicar el anlisis antes de las 24 semanas de embarazo. TRATAMIENTO El tratamiento est dirigido a mantener la glucosa en   sangre de la madre en un nivel normal y puede incluir:  La planificacin de los alimentos.   Recibir insulina u otro medicamento para controlar el nivel de glucosa en sangre.   La prctica de ejercicios.    Llevar un registro diario de los alimentos que consume.   Control y registro de los niveles de glucosa en sangre.   Control de los niveles de cetona en la orina, aunque esto ya no se considera necesario en la mayora de los embarazos.  INSTRUCCIONES PARA EL CUIDADO DOMICILIARIO Mientras est embarazada:  Siga los consejos de su mdico relacionados con los controles prenatales, la planificacin de la comida, la actividad fsica, los medicamentos, vitaminas, los anlisis de sangre y otras pruebas y las actividades fsicas.   Lleve un registro de las comidas, las pruebas de glucosa en sangre y la cantidad de insulina que recibe (si corresponde). Muestre todo al profesional en cada consulta mdica prenatal.   Si sufre diabetes mellitus gestacional, podr tener problemas de hipoglucemia (nivel bajo de glucosa en sangre). Podr sospechar este problema si se siente repentinamente mareada, tiene temblores y/o se siente dbil. Si cree que esto le est ocurriendo, y tiene un medidor de glucosa, mida su nivel de glucosa en sangre. Siga los consejos de su mdico sobre el modo y el momento de tratar su nivel de glucosa en sangre. Generalmente se sigue la regla 15:15 Consuma 15 g de hidratos de carbono, espere 15 minutos y vuelva controlar el nivel de glucosa en sangre.. Ejemplos de 15 g de hidratos de carbono son:   1 taza de leche descremada.    taza de jugo.   3-4 tabletas de glucosa.   5-6 caramelos duros.   1 caja pequea de pasas de uva.    taza de gaseosa comn.   Mantenga una buena higiene para evitar infecciones.   No fume.  SOLICITE ATENCIN MDICA SI:  Observa prdida vaginal con o sin picazn.   Se siente ms dbil o cansada que lo habitual.   Transpira mucho.   Tiene un aumento de peso repentino, 2,5 kg o ms en una semana.   Pierde peso, 1.5 kg o ms en una semana.   Su nivel de glucosa en sangre es elevado, necesita instrucciones.  SOLICITE ATENCIN MDICA DE  INMEDIATO SI:  Sufre una cefalea intensa.   Se marea o pierde el conocimiento   Presenta nuseas o vmitos.   Se siente desorientada confundida.   Sufre convulsiones.   Tiene problemas de visin.   Siente dolor en el estmago.   Presenta una hemorragia vaginal abundante.   Tiene contracciones uterinas.   Tiene una prdida importante de lquido por la vagina  DESPUS QUE NACE EL BEB:  Concurra a todos los controles de seguimiento y realice los anlisis de sangre segn las indicaciones de su mdico.   Mantenga un estilo de vida saludable para evitar la diabetes en el futuro. Aqu se incluye:   Siga el plan de alimentacin saludable.   Controle su peso.   Practique actividad fsica y descanse lo necesario.   No fume.   Amamante a su beb mientras pueda. Esto disminuir la probabilidad de que usted y su beb sufran diabetes posteriormente.  Para ms informacin acerca de la diabetes, visite la pgina web de la American Diabetes Association: www.americandiabetesassociation.org. Para ms informacin acerca de la diabetes gestacional cite la pgina web del American Congress of Obstetricians and Gynecologists en: www.acog.org. Document Released: 08/31/2005 Document Revised: 08/03/2011 ExitCare Patient Information 2012   [employer]. 

## 2012-01-30 ENCOUNTER — Ambulatory Visit (INDEPENDENT_AMBULATORY_CARE_PROVIDER_SITE_OTHER): Payer: Self-pay | Admitting: Family

## 2012-01-30 VITALS — BP 127/76 | Temp 97.8°F | Wt 267.4 lb

## 2012-01-30 DIAGNOSIS — O24419 Gestational diabetes mellitus in pregnancy, unspecified control: Secondary | ICD-10-CM

## 2012-01-30 DIAGNOSIS — O99810 Abnormal glucose complicating pregnancy: Secondary | ICD-10-CM

## 2012-01-30 LAB — POCT URINALYSIS DIP (DEVICE)
Glucose, UA: NEGATIVE mg/dL
Hgb urine dipstick: NEGATIVE
Nitrite: NEGATIVE
Urobilinogen, UA: 1 mg/dL (ref 0.0–1.0)

## 2012-01-30 NOTE — Progress Notes (Signed)
Hands in the morning.  Pain in legs.

## 2012-01-30 NOTE — Progress Notes (Signed)
Reviewed blood sugars; FBS 78-89, 97x2; PPs mainly normal, occasionally 121-125; GBS and GC/CT today; superficial varicose veins on legs>RX for support hose.  Schedule at her next visit an ultrasound for 38 wks.  Discussed with financial counselor BTL payment>unable to pay for procedure.  Discussed IUD and it's effectiveness.

## 2012-01-31 LAB — GC/CHLAMYDIA PROBE AMP, GENITAL
Chlamydia, DNA Probe: NEGATIVE
GC Probe Amp, Genital: NEGATIVE

## 2012-02-06 ENCOUNTER — Telehealth (HOSPITAL_COMMUNITY): Payer: Self-pay | Admitting: *Deleted

## 2012-02-06 ENCOUNTER — Ambulatory Visit (INDEPENDENT_AMBULATORY_CARE_PROVIDER_SITE_OTHER): Payer: Self-pay | Admitting: Family Medicine

## 2012-02-06 DIAGNOSIS — O24419 Gestational diabetes mellitus in pregnancy, unspecified control: Secondary | ICD-10-CM

## 2012-02-06 DIAGNOSIS — O99810 Abnormal glucose complicating pregnancy: Secondary | ICD-10-CM

## 2012-02-06 DIAGNOSIS — E669 Obesity, unspecified: Secondary | ICD-10-CM

## 2012-02-06 DIAGNOSIS — O99210 Obesity complicating pregnancy, unspecified trimester: Secondary | ICD-10-CM

## 2012-02-06 DIAGNOSIS — O0990 Supervision of high risk pregnancy, unspecified, unspecified trimester: Secondary | ICD-10-CM

## 2012-02-06 LAB — POCT URINALYSIS DIP (DEVICE)
Hgb urine dipstick: NEGATIVE
Ketones, ur: NEGATIVE mg/dL
Protein, ur: NEGATIVE mg/dL
pH: 6.5 (ref 5.0–8.0)

## 2012-02-06 MED ORDER — GLYBURIDE 2.5 MG PO TABS: 2.5000 mg | ORAL_TABLET | Freq: Every day | ORAL | Status: DC

## 2012-02-06 NOTE — Progress Notes (Signed)
Fasting blood sugars: 78 - 97, with 4 out of 7 > 90.  PP 98 - 126. Mostly < 120.  Will start glyburide 2.5mg  qhs.  Twice weekly testing. Will schedule [place] for growth.

## 2012-02-06 NOTE — Progress Notes (Signed)
U/S scheduled February 10, 2012 at 315 pm. IOL scheduled March 15 at 730 am.

## 2012-02-06 NOTE — Telephone Encounter (Signed)
Preadmission screen  

## 2012-02-06 NOTE — Patient Instructions (Signed)
Induccin del trabajo de parto  (Labor Induction) La mayora de las mujeres comienza el trabajo de parto sin ayuda entre las 37 y 42 semanas del embarazo. Cuando esto no sucede, o cuando hay una necesidad mdica, pueden utilizarse medicamentos u otros mtodos para inducirlo. La induccin al parto hace que el tero de la mujer embarazada se contraiga. Tambin hace que el cuello uterino se ablande (madure), se abra (se dilate), y se afine (se borre). Por lo general, el trabajo de parto no es inducido antes de las 39 semanas del embarazo excepto que haya un problema con el beb o de la madre.  Que en su caso sea inducido depende de ciertos factores, por ejemplo:   Su estado de salud y el de su beb.   Cuntas semanas tiene de embarazo.   El estado de madurez de los pulmones del beb.   El estado del cuello uterino.   La posicin del beb.  MOTIVOS PARA INDUCIR EL TRABAJO DE PARTO   La salud del beb o de la madre estn en riesgo.   El embarazo se ha pasado 1 semana o ms.   Se ha roto la bolsa de aguas, pero el parto no empieza por s mismo.   La madre tiene un problema de salud o una enfermedad grave, como hipertensin arterial, infeccin, desprendimiento de la placenta o diabetes.   Hay poca cantidad de lquido amnitico alrededor del beb.   El beb tiene sufrimiento fetal.  MOTIVOS PARA NO INDUCIR EL TRABAJO DE PARTO  La induccin del parto puede no ser una buena idea si:   Se observa que su beb no tolerar el trabajo de parto.   La induccin es slo ms conveniente.   Usted quiere que el beb nazca en una fecha determinada, como un da de fiesta.   Ha tenido cirugas previas en el tero, como una miomectoma o la extraccin de fibromas.   La placenta se encuentra muy baja en el tero y obstruye la abertura del cuello del tero (placenta previa).   El beb no est en posicin de cabeza.   El cordn umbilical cae por el canal del parto, frente al beb. En este caso podra  cortarse el suministro de sangre de oxgeno al beb.   Usted ha tenido antes un parto por cesrea.   Hay circunstancias inusuales, como que el beb sea muy prematuro.  RIESGOS Y COMPLICACIONES  Pueden ocurrir problemas en el proceso de induccin y podr ser necesario modificar los planes segn desarrollo de los acontecimientos. Algunos de los riesgos de la induccin son:   Cambio en la frecuencia cardaca fetal, tales como que sea muy alta, muy baja, o errtica.   Riesgo de sufrimiento fetal.   Riesgo de infeccin para la madre y el beb.   Aumento de la posibilidad de tener un parto por cesrea.   Posibilidad rara, pero aumentada de que la placenta se separe del tero (desprendimiento).   Rotura uterina (muy raro).  Cuando la induccin es necesaria por razones mdicas, los beneficios de la induccin pueden ser mayores que los riesgos.  ANTES DEL PROCEDIMIENTO  El mdico controlar el cuello y la posicin del beb. Esto lo ayudar a decidir si est en condiciones de una induccin al parto.  PROCEDIMIENTO  Se pueden utilizar varios mtodos de induccin del parto, como:   Tomar medicamentos como prostaglandinas para dilatar y madurar el cuello del tero. Este medicamento tambin iniciar las contracciones. Podr ser administrado por va oral o mediante   la insercin de un supositorio en la vagina.   Podrn insertarle un tubo delgado (catter) con un globo en el extremo en la vagina para dilatar el cuello del tero. Una vez insertado, el globo se expande con agua, lo que hace que el cuello uterino se abra.   Ruptura de las membranas. El mdico inserta un dedo entre el cuello del tero y las membranas, lo que hace que el cuello del tero se estire y podr hacer que el tero se contraiga. Esto se suele hacer durante una visita al consultorio. Se la enviar a su casa a esperar a que las contracciones comiencen. Luego vendr para la induccin.   Ruptura de la bolsa de aguas. Su mdico har un  agujero en el saco amnitico con un instrumento pequeo. Una vez que se rompe el saco amnitico, las contracciones deben comenzar. Pueden pasar algunas horas para ver su efecto.   Tomar medicamentos para desencadenar o reforzar las contracciones. Este medicamento se administra por va intravenosa a travs de un tubo en el brazo.  Todos los mtodos de induccin, adems la rotura de membranas, se realizan en el hospital. La induccin se realiza en el hospital, de modo que usted y el beb sean atentamente controlados.  DESPUS DEL PROCEDIMIENTO  Algunas inducciones pueden llevar hasta 2 o 3 das. Dependiendo del cuello del tero, por lo general toma menos tiempo. Se tarda ms tiempo cuando se induce al inicio del embarazo o si es su primer embarazo. Si la madre est todava embarazada y han realizado la induccin durante 2 a 3 das, ser necesario que sea enviada a su casa o que tenga un parto por cesrea.  Document Released: 02/28/2008 Document Revised: 11/10/2011 ExitCare Patient Information 2012 [employer]. 

## 2012-02-06 NOTE — Progress Notes (Signed)
Pulse: 89

## 2012-02-07 ENCOUNTER — Ambulatory Visit (HOSPITAL_COMMUNITY): Payer: Self-pay

## 2012-02-09 ENCOUNTER — Encounter: Payer: Self-pay | Admitting: Obstetrics and Gynecology

## 2012-02-10 ENCOUNTER — Ambulatory Visit (HOSPITAL_COMMUNITY)
Admission: RE | Admit: 2012-02-10 | Discharge: 2012-02-10 | Disposition: A | Discharge status: Home / Self Care | Payer: Self-pay | Source: Ambulatory Visit | Attending: Family Medicine | Admitting: Family Medicine

## 2012-02-10 DIAGNOSIS — O24419 Gestational diabetes mellitus in pregnancy, unspecified control: Secondary | ICD-10-CM

## 2012-02-10 DIAGNOSIS — E669 Obesity, unspecified: Secondary | ICD-10-CM | POA: Insufficient documentation

## 2012-02-10 DIAGNOSIS — O99810 Abnormal glucose complicating pregnancy: Secondary | ICD-10-CM | POA: Insufficient documentation

## 2012-02-13 ENCOUNTER — Ambulatory Visit (INDEPENDENT_AMBULATORY_CARE_PROVIDER_SITE_OTHER): Payer: Self-pay | Admitting: Obstetrics & Gynecology

## 2012-02-13 ENCOUNTER — Telehealth (HOSPITAL_COMMUNITY): Payer: Self-pay | Admitting: *Deleted

## 2012-02-13 VITALS — BP 129/78 | Temp 98.5°F | Wt 269.9 lb

## 2012-02-13 DIAGNOSIS — O99810 Abnormal glucose complicating pregnancy: Secondary | ICD-10-CM

## 2012-02-13 DIAGNOSIS — O24419 Gestational diabetes mellitus in pregnancy, unspecified control: Secondary | ICD-10-CM

## 2012-02-13 LAB — POCT URINALYSIS DIP (DEVICE)
Hgb urine dipstick: NEGATIVE
Protein, ur: NEGATIVE mg/dL
Specific Gravity, Urine: 1.02 (ref 1.005–1.030)
Urobilinogen, UA: 1 mg/dL (ref 0.0–1.0)
pH: 6 (ref 5.0–8.0)

## 2012-02-13 NOTE — Progress Notes (Signed)
Edema-hands in the am. Pelvic pressure. Pulse 87.

## 2012-02-13 NOTE — Progress Notes (Signed)
Addended by: Jill Side on: 02/13/2012 10:24 AM   Modules accepted: Orders

## 2012-02-13 NOTE — Patient Instructions (Signed)
Induccin del trabajo de parto  (Labor Induction) La mayora de las mujeres comienza el trabajo de parto sin ayuda entre las 37 y 42 semanas del embarazo. Cuando esto no sucede, o cuando hay una necesidad mdica, pueden utilizarse medicamentos u otros mtodos para inducirlo. La induccin al parto hace que el tero de la mujer embarazada se contraiga. Tambin hace que el cuello uterino se ablande (madure), se abra (se dilate), y se afine (se borre). Por lo general, el trabajo de parto no es inducido antes de las 39 semanas del embarazo excepto que haya un problema con el beb o de la madre.  Que en su caso sea inducido depende de ciertos factores, por ejemplo:   Su estado de salud y el de su beb.   Cuntas semanas tiene de embarazo.   El estado de madurez de los pulmones del beb.   El estado del cuello uterino.   La posicin del beb.  MOTIVOS PARA INDUCIR EL TRABAJO DE PARTO   La salud del beb o de la madre estn en riesgo.   El embarazo se ha pasado 1 semana o ms.   Se ha roto la bolsa de aguas, pero el parto no empieza por s mismo.   La madre tiene un problema de salud o una enfermedad grave, como hipertensin arterial, infeccin, desprendimiento de la placenta o diabetes.   Hay poca cantidad de lquido amnitico alrededor del beb.   El beb tiene sufrimiento fetal.  MOTIVOS PARA NO INDUCIR EL TRABAJO DE PARTO  La induccin del parto puede no ser una buena idea si:   Se observa que su beb no tolerar el trabajo de parto.   La induccin es slo ms conveniente.   Usted quiere que el beb nazca en una fecha determinada, como un da de fiesta.   Ha tenido cirugas previas en el tero, como una miomectoma o la extraccin de fibromas.   La placenta se encuentra muy baja en el tero y obstruye la abertura del cuello del tero (placenta previa).   El beb no est en posicin de cabeza.   El cordn umbilical cae por el canal del parto, frente al beb. En este caso podra  cortarse el suministro de sangre de oxgeno al beb.   Usted ha tenido antes un parto por cesrea.   Hay circunstancias inusuales, como que el beb sea muy prematuro.  RIESGOS Y COMPLICACIONES  Pueden ocurrir problemas en el proceso de induccin y podr ser necesario modificar los planes segn desarrollo de los acontecimientos. Algunos de los riesgos de la induccin son:   Cambio en la frecuencia cardaca fetal, tales como que sea muy alta, muy baja, o errtica.   Riesgo de sufrimiento fetal.   Riesgo de infeccin para la madre y el beb.   Aumento de la posibilidad de tener un parto por cesrea.   Posibilidad rara, pero aumentada de que la placenta se separe del tero (desprendimiento).   Rotura uterina (muy raro).  Cuando la induccin es necesaria por razones mdicas, los beneficios de la induccin pueden ser mayores que los riesgos.  ANTES DEL PROCEDIMIENTO  El mdico controlar el cuello y la posicin del beb. Esto lo ayudar a decidir si est en condiciones de una induccin al parto.  PROCEDIMIENTO  Se pueden utilizar varios mtodos de induccin del parto, como:   Tomar medicamentos como prostaglandinas para dilatar y madurar el cuello del tero. Este medicamento tambin iniciar las contracciones. Podr ser administrado por va oral o mediante   la insercin de un supositorio en la vagina.   Podrn insertarle un tubo delgado (catter) con un globo en el extremo en la vagina para dilatar el cuello del tero. Una vez insertado, el globo se expande con agua, lo que hace que el cuello uterino se abra.   Ruptura de las membranas. El mdico inserta un dedo entre el cuello del tero y las membranas, lo que hace que el cuello del tero se estire y podr hacer que el tero se contraiga. Esto se suele hacer durante una visita al consultorio. Se la enviar a su casa a esperar a que las contracciones comiencen. Luego vendr para la induccin.   Ruptura de la bolsa de aguas. Su mdico har un  agujero en el saco amnitico con un instrumento pequeo. Una vez que se rompe el saco amnitico, las contracciones deben comenzar. Pueden pasar algunas horas para ver su efecto.   Tomar medicamentos para desencadenar o reforzar las contracciones. Este medicamento se administra por va intravenosa a travs de un tubo en el brazo.  Todos los mtodos de induccin, adems la rotura de membranas, se realizan en el hospital. La induccin se realiza en el hospital, de modo que usted y el beb sean atentamente controlados.  DESPUS DEL PROCEDIMIENTO  Algunas inducciones pueden llevar hasta 2 o 3 das. Dependiendo del cuello del tero, por lo general toma menos tiempo. Se tarda ms tiempo cuando se induce al inicio del embarazo o si es su primer embarazo. Si la madre est todava embarazada y han realizado la induccin durante 2 a 3 das, ser necesario que sea enviada a su casa o que tenga un parto por cesrea.  Document Released: 02/28/2008 Document Revised: 11/10/2011 ExitCare Patient Information 2012 [employer]. 

## 2012-02-13 NOTE — Progress Notes (Deleted)
NST toda

## 2012-02-13 NOTE — Progress Notes (Signed)
NST reactive 3/11

## 2012-02-13 NOTE — Telephone Encounter (Signed)
Preadmission screen Translator number [phone]

## 2012-02-13 NOTE — Progress Notes (Signed)
FBS<90, pp <120 after starting glyburide. No complaints. NST today, induction is scheduled 3/15 AM

## 2012-02-14 ENCOUNTER — Encounter (HOSPITAL_COMMUNITY): Payer: Self-pay | Admitting: *Deleted

## 2012-02-14 ENCOUNTER — Telehealth (HOSPITAL_COMMUNITY): Payer: Self-pay | Admitting: *Deleted

## 2012-02-14 LAB — STREP B DNA PROBE: GBS: NEGATIVE

## 2012-02-14 NOTE — Telephone Encounter (Signed)
Preadmission screen  

## 2012-02-17 ENCOUNTER — Inpatient Hospital Stay (HOSPITAL_COMMUNITY): Admission: RE | Admit: 2012-02-17 | Payer: Self-pay | Source: Ambulatory Visit

## 2012-02-22 ENCOUNTER — Encounter (HOSPITAL_COMMUNITY): Payer: Self-pay

## 2012-02-22 ENCOUNTER — Inpatient Hospital Stay (HOSPITAL_COMMUNITY)
Admission: AD | Admit: 2012-02-22 | Discharge: 2012-02-24 | DRG: 775 | Disposition: A | Discharge status: Home / Self Care | Payer: Medicaid Other | Source: Ambulatory Visit | Attending: Obstetrics and Gynecology | Admitting: Obstetrics and Gynecology

## 2012-02-22 DIAGNOSIS — O24419 Gestational diabetes mellitus in pregnancy, unspecified control: Secondary | ICD-10-CM

## 2012-02-22 DIAGNOSIS — O99814 Abnormal glucose complicating childbirth: Secondary | ICD-10-CM

## 2012-02-22 DIAGNOSIS — O0990 Supervision of high risk pregnancy, unspecified, unspecified trimester: Secondary | ICD-10-CM

## 2012-02-22 DIAGNOSIS — O99210 Obesity complicating pregnancy, unspecified trimester: Secondary | ICD-10-CM

## 2012-02-22 LAB — CBC
HCT: 38.8 % (ref 36.0–46.0)
MCV: 89.6 fL (ref 78.0–100.0)
RBC: 4.33 MIL/uL (ref 3.87–5.11)
WBC: 11.1 10*3/uL — ABNORMAL HIGH (ref 4.0–10.5)

## 2012-02-22 MED ORDER — OXYTOCIN 20 UNITS IN LACTATED RINGERS INFUSION - SIMPLE: 125.0000 mL/h | Freq: Once | INTRAVENOUS | Status: DC

## 2012-02-22 MED ORDER — ONDANSETRON HCL 4 MG/2ML IJ SOLN: 4.0000 mg | Freq: Four times a day (QID) | INTRAMUSCULAR | Status: DC | PRN

## 2012-02-22 MED ORDER — OXYTOCIN BOLUS FROM INFUSION
500.0000 mL | Freq: Once | INTRAVENOUS | Status: DC
  Filled 2012-02-22: qty 500

## 2012-02-22 MED ORDER — FLEET ENEMA 7-19 GM/118ML RE ENEM: 1.0000 | ENEMA | RECTAL | Status: DC | PRN

## 2012-02-22 MED ORDER — LACTATED RINGERS IV SOLN
INTRAVENOUS | Status: DC
  Administered 2012-02-22: 125 mL/h via INTRAVENOUS

## 2012-02-22 MED ORDER — FENTANYL CITRATE 0.05 MG/ML IJ SOLN
50.0000 ug | INTRAMUSCULAR | Status: DC | PRN
  Administered 2012-02-23: 50 ug via INTRAVENOUS
  Filled 2012-02-22: qty 2

## 2012-02-22 MED ORDER — CITRIC ACID-SODIUM CITRATE 334-500 MG/5ML PO SOLN: 30.0000 mL | ORAL | Status: DC | PRN

## 2012-02-22 MED ORDER — ACETAMINOPHEN 325 MG PO TABS
650.0000 mg | ORAL_TABLET | ORAL | Status: DC | PRN
  Administered 2012-02-22: 650 mg via ORAL
  Filled 2012-02-22: qty 2

## 2012-02-22 MED ORDER — IBUPROFEN 600 MG PO TABS
600.0000 mg | ORAL_TABLET | Freq: Four times a day (QID) | ORAL | Status: DC | PRN
  Administered 2012-02-23: 600 mg via ORAL
  Filled 2012-02-22: qty 1

## 2012-02-22 MED ORDER — OXYCODONE-ACETAMINOPHEN 5-325 MG PO TABS: 1.0000 | ORAL_TABLET | ORAL | Status: DC | PRN

## 2012-02-22 MED ORDER — OXYTOCIN 20 UNITS IN LACTATED RINGERS INFUSION - SIMPLE
1.0000 m[IU]/min | INTRAVENOUS | Status: DC
  Administered 2012-02-22: 2 m[IU]/min via INTRAVENOUS
  Filled 2012-02-22: qty 1000

## 2012-02-22 MED ORDER — TERBUTALINE SULFATE 1 MG/ML IJ SOLN: 0.2500 mg | Freq: Once | INTRAMUSCULAR | Status: AC | PRN

## 2012-02-22 MED ORDER — LIDOCAINE HCL (PF) 1 % IJ SOLN
30.0000 mL | INTRAMUSCULAR | Status: DC | PRN
  Filled 2012-02-22: qty 30

## 2012-02-22 MED ORDER — LACTATED RINGERS IV SOLN: 500.0000 mL | INTRAVENOUS | Status: DC | PRN

## 2012-02-22 NOTE — MAU Note (Signed)
Pt states was supposed to be induced last week for gestational diabetes but couldn't come. Contractions today every 10 minutes. Leaking yellow mucousy discharge since 3pm today.

## 2012-02-22 NOTE — H&P (Signed)
Cheryl Kelley is a 36 y.o. female presenting for rupture of membranes and pelvic cramping and mild contractions.  She was scheduled for Induction of labor due to gestational diabetes.  She was scheduled for induction on 02/17/12 as that was when her GA was 39 weeks.  She is 39.5 today.  She did not come to scheduled induction because her sister was sick.   She thinks her water broke at about 3 pm, and is feeling contractions every 5 minutes.   History OB History    Grav Para Term Preterm Abortions TAB SAB Ect Mult Living   5 4 4  0 0 0 0 0 0 4     Past Medical History  Diagnosis Date  . Gestational diabetes     was on glyburide   Past Surgical History  Procedure Date  . No past surgeries   . Back surgery    Family History: family history includes Diabetes in her mother and sisters.  There is no history of Anesthesia problems. Social History:  reports that she has never smoked. She has never used smokeless tobacco. She reports that she does not drink alcohol or use illicit drugs.  ROS    Last menstrual period 05/20/2011. Maternal Exam:  Uterine Assessment: Contraction strength is mild.  Contraction frequency is irregular.   Abdomen: Patient reports no abdominal tenderness. Fetal presentation: vertex     Physical Exam  Constitutional: She is oriented to person, place, and time. She appears well-developed and well-nourished. No distress.  HENT:  Head: Normocephalic.  Eyes: Pupils are equal, round, and reactive to light.  Neck: No JVD present.  Cardiovascular: Normal rate, regular rhythm and normal heart sounds.   Respiratory: Effort normal and breath sounds normal.  GI:       Gravid, non-tender.   Genitourinary:       Sterile digital exam: 3/60%/-2, membranes felt, no fluid noted.   Neurological: She is alert and oriented to person, place, and time.    FHT's: baseline 130's, + accels, no decels, moderate variability, Category I tracing.  Toco: Few contractions 7-10  minutes apart.   Prenatal labs: ABO, Rh: O/Positive/-- (10/08 0000) Antibody: Negative (10/08 0000) Rubella: Immune (10/08 0000) RPR: Nonreactive (01/14 0000)  HBsAg: Negative (10/08 0000)  HIV: Non-reactive (01/14 0000)  GBS: Negative (03/12 0000)   Assessment/Plan: 36 year old G5P4 at 64.5 with gestational diabetes who missed her induction 5 days ago, who presents in early labor:  -Admit to L & D to augment early labor with pitocin - Q4H CBG checks with SSI coverage.  - GBS neg - patient unsure about epidural -anticipate NSVD.     Taffany Heiser 02/22/2012, 6:37 PM

## 2012-02-22 NOTE — Progress Notes (Signed)
   Leilani Rosas-Flores is a 36 y.o. [id number] at [redacted]w[redacted]d  admitted for induction of labor due to Gestational diabetes.  Subjective: No contractions   Objective: BP 128/87  Pulse 83  Temp(Src) 97.9 F (36.6 C) (Oral)  Resp 20  Ht 4\' 10"  (1.473 m)  Wt 267 lb (121.11 kg)  BMI 55.80 kg/m2  LMP 05/20/2011   EFW 2 weeks ago ~ 7# FHT:  FHR: 140 bpm, variability: moderate,  accelerations:  Present,  decelerations:  Absent UC:   none SVE:   Dilation: 3 Effacement (%): 50 Station: -2 Exam by:: Cresenzo-Dishmon, CNM  Labs: Lab Results  Component Value Date   WBC 11.1* 02/22/2012   HGB 13.1 02/22/2012   HCT 38.8 02/22/2012   MCV 89.6 02/22/2012   PLT 237 02/22/2012    Assessment / Plan: IOL 2/2 A2DM, favorable cervix.  Will start Pitocin per low dose protocol  Labor: no Fetal Wellbeing:  Category I Pain Control:  Labor support without medications Anticipated MOD:  NSVD  CRESENZO-DISHMAN,Willis Kuipers 02/22/2012, 8:48 PM

## 2012-02-23 ENCOUNTER — Encounter (HOSPITAL_COMMUNITY): Payer: Self-pay | Admitting: *Deleted

## 2012-02-23 MED ORDER — WITCH HAZEL-GLYCERIN EX PADS: 1.0000 "application " | MEDICATED_PAD | CUTANEOUS | Status: DC | PRN

## 2012-02-23 MED ORDER — FLEET ENEMA 7-19 GM/118ML RE ENEM: 1.0000 | ENEMA | Freq: Every day | RECTAL | Status: DC | PRN

## 2012-02-23 MED ORDER — BISACODYL 10 MG RE SUPP: 10.0000 mg | Freq: Every day | RECTAL | Status: DC | PRN

## 2012-02-23 MED ORDER — IBUPROFEN 600 MG PO TABS
600.0000 mg | ORAL_TABLET | Freq: Four times a day (QID) | ORAL | Status: DC
  Administered 2012-02-23 – 2012-02-24 (×4): 600 mg via ORAL
  Filled 2012-02-23 (×5): qty 1

## 2012-02-23 MED ORDER — METHYLERGONOVINE MALEATE 0.2 MG PO TABS: 0.2000 mg | ORAL_TABLET | ORAL | Status: DC | PRN

## 2012-02-23 MED ORDER — DIBUCAINE 1 % RE OINT: 1.0000 "application " | TOPICAL_OINTMENT | RECTAL | Status: DC | PRN

## 2012-02-23 MED ORDER — LANOLIN HYDROUS EX OINT: TOPICAL_OINTMENT | CUTANEOUS | Status: DC | PRN

## 2012-02-23 MED ORDER — ONDANSETRON HCL 4 MG PO TABS: 4.0000 mg | ORAL_TABLET | ORAL | Status: DC | PRN

## 2012-02-23 MED ORDER — SENNOSIDES-DOCUSATE SODIUM 8.6-50 MG PO TABS
2.0000 | ORAL_TABLET | Freq: Every day | ORAL | Status: DC
  Administered 2012-02-23: 2 via ORAL

## 2012-02-23 MED ORDER — ZOLPIDEM TARTRATE 5 MG PO TABS: 5.0000 mg | ORAL_TABLET | Freq: Every evening | ORAL | Status: DC | PRN

## 2012-02-23 MED ORDER — SIMETHICONE 80 MG PO CHEW: 80.0000 mg | CHEWABLE_TABLET | ORAL | Status: DC | PRN

## 2012-02-23 MED ORDER — DIPHENHYDRAMINE HCL 25 MG PO CAPS: 25.0000 mg | ORAL_CAPSULE | Freq: Four times a day (QID) | ORAL | Status: DC | PRN

## 2012-02-23 MED ORDER — PRENATAL MULTIVITAMIN CH
1.0000 | ORAL_TABLET | Freq: Every day | ORAL | Status: DC
  Administered 2012-02-23 – 2012-02-24 (×2): 1 via ORAL
  Filled 2012-02-23 (×2): qty 1

## 2012-02-23 MED ORDER — ONDANSETRON HCL 4 MG/2ML IJ SOLN: 4.0000 mg | INTRAMUSCULAR | Status: DC | PRN

## 2012-02-23 MED ORDER — OXYCODONE-ACETAMINOPHEN 5-325 MG PO TABS
1.0000 | ORAL_TABLET | ORAL | Status: DC | PRN
  Administered 2012-02-23 – 2012-02-24 (×2): 1 via ORAL
  Filled 2012-02-23 (×2): qty 1

## 2012-02-23 MED ORDER — BENZOCAINE-MENTHOL 20-0.5 % EX AERO: 1.0000 "application " | INHALATION_SPRAY | CUTANEOUS | Status: DC | PRN

## 2012-02-23 MED ORDER — METHYLERGONOVINE MALEATE 0.2 MG/ML IJ SOLN: 0.2000 mg | INTRAMUSCULAR | Status: DC | PRN

## 2012-02-23 NOTE — Progress Notes (Signed)
UR Chart review completed.  

## 2012-02-23 NOTE — Progress Notes (Signed)
   Kourtnei Rosas-Flores is a 36 y.o. Z6X0960 at [redacted]w[redacted]d  admitted for induction of labor due to Gestational diabetes.  Subjective:  Beginning to feel contractions Objective: BP 110/63  Pulse 83  Temp(Src) 98.1 F (36.7 C) (Oral)  Resp 22  Ht 4\' 10"  (1.473 m)  Wt 267 lb (121.11 kg)  BMI 55.80 kg/m2  LMP 05/20/2011    FHT:  FHR: 140 bpm, variability: moderate,  accelerations:  Present,  decelerations:  Absent UC:   regular, every 4-5 minutes SVE:   Dilation: 3 Effacement (%): 50 Station: -2 Exam by:: F. Cresenzo, cnm  Labs: Lab Results  Component Value Date   WBC 11.1* 02/22/2012   HGB 13.1 02/22/2012   HCT 38.8 02/22/2012   MCV 89.6 02/22/2012   PLT 237 02/22/2012    Assessment / Plan: Induction of labor due to gestational diabetes,  progressing well on pitocin  Labor: Progressing normally Fetal Wellbeing:  Category I Pain Control:  Labor support without medications Anticipated MOD:  NSVD  CRESENZO-DISHMAN,Santi Troung 02/23/2012, 12:35 AM

## 2012-02-24 MED ORDER — TETANUS-DIPHTH-ACELL PERTUSSIS 5-2.5-18.5 LF-MCG/0.5 IM SUSP
0.5000 mL | Freq: Once | INTRAMUSCULAR | Status: AC
  Administered 2012-02-24: 0.5 mL via INTRAMUSCULAR
  Filled 2012-02-24: qty 0.5

## 2012-02-24 NOTE — Discharge Summary (Signed)
I have seen and examined this patient in conjunction with Dr Lula Olszewski.  I have taken this history and performed the exam.  I agree with the note as written above and have made corrections as needed.   Cheryl Kelley 02/24/2012 9:07 AM

## 2012-02-24 NOTE — Discharge Summary (Signed)
Obstetric Discharge Summary Reason for Admission: induction of labor Prenatal Procedures: ultrasound Intrapartum Procedures: spontaneous vaginal delivery Postpartum Procedures: none Complications-Operative and Postpartum: none Hemoglobin  Date Value Range Status  02/22/2012 13.1  12.0-15.0 (g/dL) Final     HCT  Date Value Range Status  02/22/2012 38.8  36.0-46.0 (%) Final    Physical Exam:  General: alert, cooperative and no distress Lochia: appropriate Uterine Fundus: firm DVT Evaluation: No evidence of DVT seen on physical exam.  Discharge Diagnoses: Term Pregnancy-delivered  Discharge Information: Date: 02/24/2012 Activity: pelvic rest Diet: routine Medications: PNV Condition: stable Instructions: refer to practice specific booklet Discharge to: home Follow-up Information    Follow up with [facility]. Schedule an appointment as soon as possible for a visit in 6 weeks.         Newborn Data: Live born female  Birth Weight: 7 lb 1.4 oz (3215 g) APGAR: 9, 9  Home with mother.  Ndrew Creason 02/24/2012, 7:59 AM

## 2012-02-24 NOTE — Discharge Instructions (Signed)
Parto vaginal °Cuidados luego °(Vaginal Delivery, Care After) °· Cambie el apósito cada vez que vaya al baño.  °· Límpiese suavemente con un papel tisú durante su estadía en el hospital, y siempre hágalo desde adelante hacia atrás. Puede utilizar un rociador con agua caliente del grifo o el tisú descartable, o ambos.  °· Coloque el apósito y el tisú sucios en el cesto de basura del baño, en la bolsa plástica de color rojo.  °· Durante su permanencia en el hospital, guarde los coágulos. Si elimina un coágulo, por favor no tire la cadena. También, si su flujo vaginal le parece excesivo, notifíquelo al personal de enfermería.  °· La primera vez que se levante de la cama después del parto, espere la ayuda de la enfermera. No se levante sola en ningún momento si se siente débil o mareada.  °· Flexione y extienda los tobillos vigorosamente, de modo que pueda sentir que las pantorrillas se endurecen, media docena de veces por hora, cuando está en la cama y despierta.  °· No se siente sobre un pie suyo ni deje las piernas colgando del borde de la cama ni mantenga una posición que dificulte la circulación de las piernas.  °· Muchas mujeres experimentan dolores de entuerto (contracciones uterinas leves) en los dos o tres días después del parto. Pídale a la enfermera un medicamento contra el dolor si lo necesita. Algunas veces la alimentación a pecho estimula los "entuertos"; si le ocurre esto, pida la medicación ½ - ¾ de hora antes de la próxima vez que alimente al bebé.  °· Para su protección y la de su bebé, le pedimos que no traspase las puertas dobles de la entrada de la unidad de obstetricia. No lleve al bebé en brazos por el corredor. Cuando saque o entre al bebé de la habitación, por favor colóquelo en el cochecito y empújelo.  °· Las mamás pueden tener a sus bebés en la habitación todo lo que deseen.  °Document Released: 11/21/2005 Document Revised: 11/10/2011 °ExitCare® Patient Information ©2012 [employer]. °

## 2012-02-24 NOTE — Progress Notes (Signed)
Post Partum Day # 1 Subjective: no complaints, up ad lib, voiding, tolerating PO and + flatus  Objective: Blood pressure 113/68, pulse 75, temperature 98.2 F (36.8 C), temperature source Oral, resp. rate 18, height 4\' 10"  (1.473 m), weight 121.11 kg (267 lb), last menstrual period 05/20/2011, unknown if currently breastfeeding.  Physical Exam:  General: alert, cooperative and no distress Lochia: appropriate Uterine Fundus: firm DVT Evaluation: No evidence of DVT seen on physical exam.   Basename 02/22/12 1945  HGB 13.1  HCT 38.8    Assessment/Plan: Plan for discharge tomorrow and Breastfeeding   LOS: 2 days   Cheryl Kelley 02/24/2012, 7:49 AM

## 2012-02-27 LAB — GLUCOSE, CAPILLARY
Glucose-Capillary: 85 mg/dL (ref 70–99)
Glucose-Capillary: 85 mg/dL (ref 70–99)

## 2012-02-28 NOTE — H&P (Signed)
Patient admitted under my supervision, I agree with documentation.

## 2012-03-16 ENCOUNTER — Ambulatory Visit: Payer: Self-pay | Admitting: Obstetrics and Gynecology

## 2012-03-28 ENCOUNTER — Ambulatory Visit: Payer: Self-pay | Admitting: Obstetrics and Gynecology

## 2012-04-04 ENCOUNTER — Ambulatory Visit (INDEPENDENT_AMBULATORY_CARE_PROVIDER_SITE_OTHER): Payer: Self-pay | Admitting: Physician Assistant

## 2012-04-04 ENCOUNTER — Encounter: Payer: Self-pay | Admitting: Physician Assistant

## 2012-04-04 DIAGNOSIS — Z8632 Personal history of gestational diabetes: Secondary | ICD-10-CM | POA: Insufficient documentation

## 2012-04-04 DIAGNOSIS — E669 Obesity, unspecified: Secondary | ICD-10-CM

## 2012-04-04 HISTORY — DX: Personal history of gestational diabetes: Z86.32

## 2012-04-04 HISTORY — DX: Obesity, unspecified: E66.9

## 2012-04-04 LAB — POCT PREGNANCY, URINE: Preg Test, Ur: NEGATIVE

## 2012-04-04 NOTE — Patient Instructions (Signed)
Intrauterine Device Information An intrauterine device (IUD) is inserted into your uterus and prevents pregnancy. There are 2 types of IUDs available:  Copper IUD. This type of IUD is wrapped in copper wire and is placed inside the uterus. Copper makes the uterus and fallopian tubes produce a fluid that kills sperm. The copper IUD can stay in place for 10 years.   Hormone IUD. This type of IUD contains the hormone progestin (synthetic progesterone). The hormone thickens the cervical mucus and prevents sperm from entering the uterus, and it also thins the uterine lining to prevent implantation of a fertilized egg. The hormone can weaken or kill the sperm that get into the uterus. The hormone IUD can stay in place for 5 years.  Your caregiver will make sure you are a good candidate for a contraceptive IUD. Discuss with your caregiver the possible side effects. ADVANTAGES  It is highly effective, reversible, long-acting, and low maintenance.   There are no estrogen-related side effects.   An IUD can be used when breastfeeding.   It is not associated with weight gain.   It works immediately after insertion.   The copper IUD does not interfere with your female hormones.   The progesterone IUD can make heavy menstrual periods lighter.   The progesterone IUD can be used for 5 years.   The copper IUD can be used for 10 years.  DISADVANTAGES  The progesterone IUD can be associated with irregular bleeding patterns.   The copper IUD can make your menstrual flow heavier and more painful.   You may experience cramping and vaginal bleeding after insertion.  Document Released: 10/25/2004 Document Revised: 11/10/2011 Document Reviewed: 03/26/2011 ExitCare Patient Information 2012 [employer]. 

## 2012-04-04 NOTE — Progress Notes (Signed)
  Subjective:     Cheryl Kelley is a 36 y.o. female who presents for a postpartum visit. She is 6 weeks postpartum following a spontaneous vaginal delivery. I have fully reviewed the prenatal and intrapartum course. The delivery was at term gestational weeks. Outcome: spontaneous vaginal delivery. Postpartum course has been uncomplicated. Baby's course has been uncomplicated. Baby is feeding by breast. Bleeding staining only. Bowel function is normal. Bladder function is normal. Patient is not sexually active. Contraception method is abstinence and desires mirena IUD. Postpartum depression screening: negative. Pregancy was complicated by GDM.  The following portions of the patient's history were reviewed and updated as appropriate: allergies, current medications, past family history, past medical history, past social history, past surgical history and problem list.  Review of Systems A comprehensive review of systems was negative.   Objective:    BP 105/75  Pulse 59  Temp 97 F (36.1 C)  Ht 4\' 10"  (1.473 m)  Wt 250 lb (113.399 kg)  BMI 52.25 kg/m2  Breastfeeding? Yes  General:  alert, cooperative and no distress   Breasts:  inspection negative, no nipple discharge or bleeding, no masses or nodularity palpable        Assessment:    6 postpartum exam. Pap smear not done at today's visit.   Plan:    1. Contraception: IUD 2. [employer] paperwork today 3. Follow up in: when IUD arrives 4. FU next week for 2 GTT

## 2012-04-10 ENCOUNTER — Other Ambulatory Visit: Payer: Self-pay

## 2012-04-13 ENCOUNTER — Other Ambulatory Visit: Payer: Self-pay

## 2012-04-17 ENCOUNTER — Other Ambulatory Visit: Payer: Self-pay

## 2012-04-17 DIAGNOSIS — Z8632 Personal history of gestational diabetes: Secondary | ICD-10-CM

## 2013-05-03 IMAGING — US US OB DETAIL+14 WK
1 series · 12 of 28 positions shown · non-contrast
Comparison: none

[Series 1: us ob detail +14 wk · 12 of 62 slices shown]
[im 3/62]
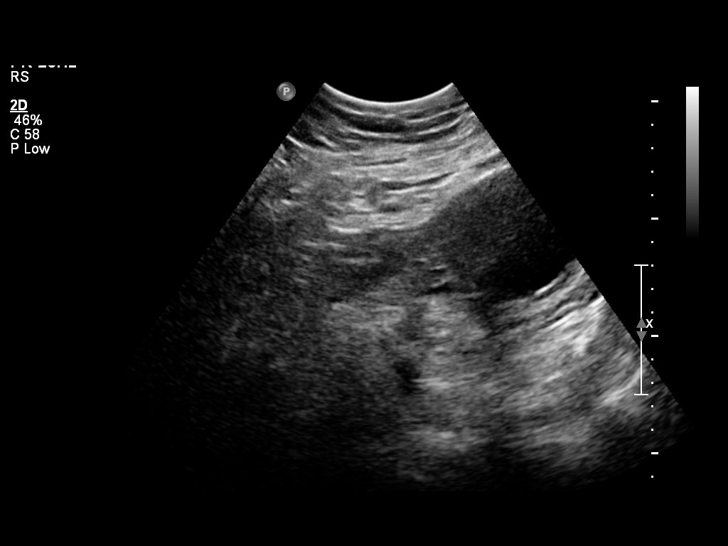
[im 7/62]
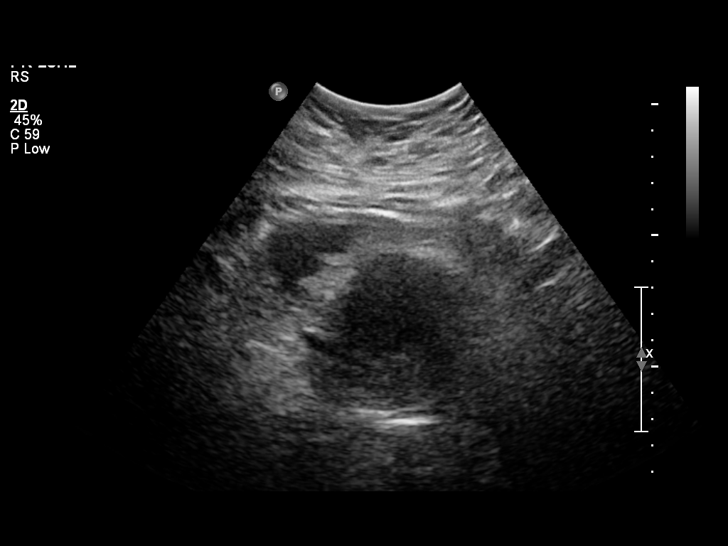
[im 12/62]
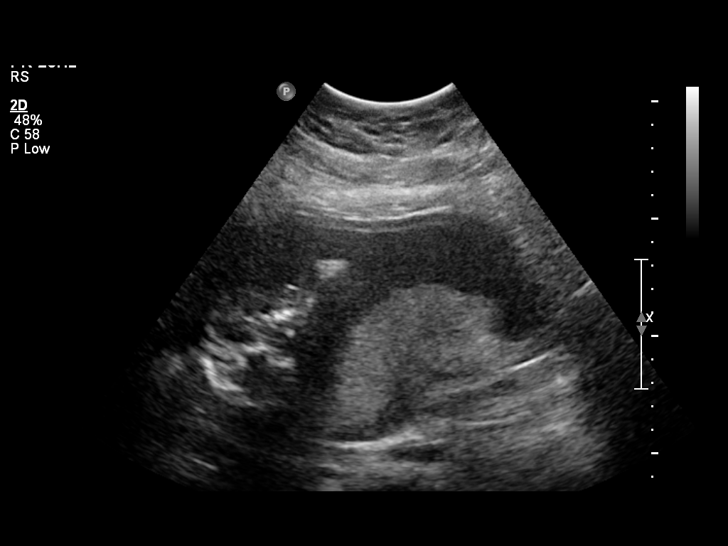
[im 19/62]
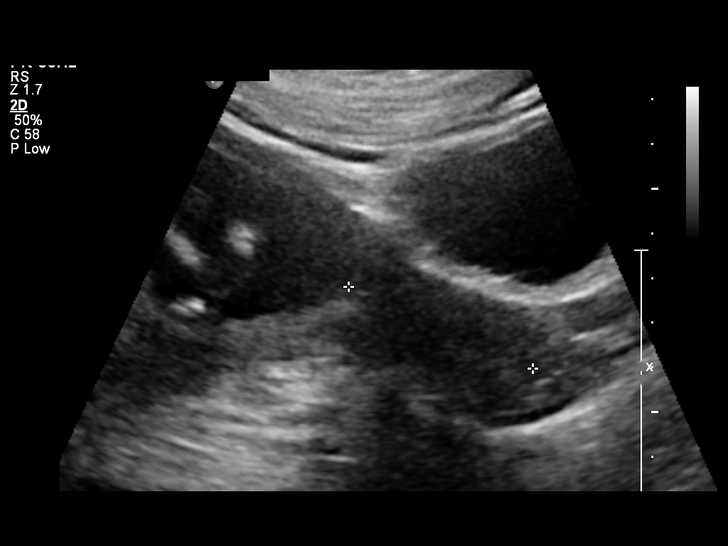
[im 23/62]
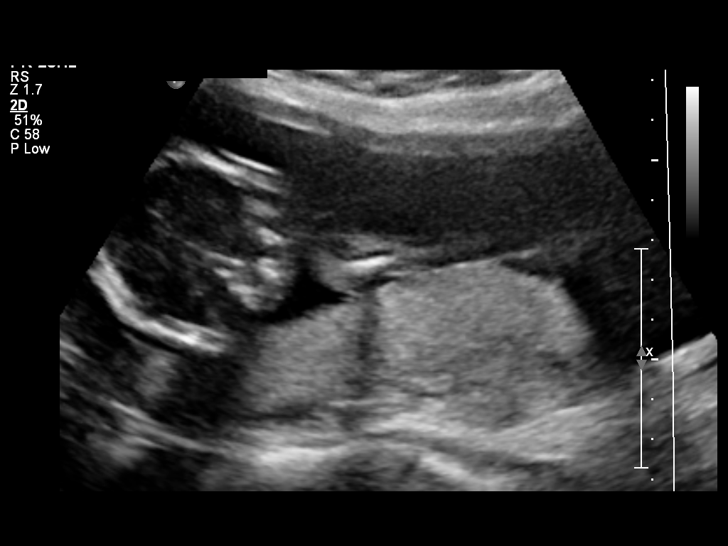
[im 28/62]
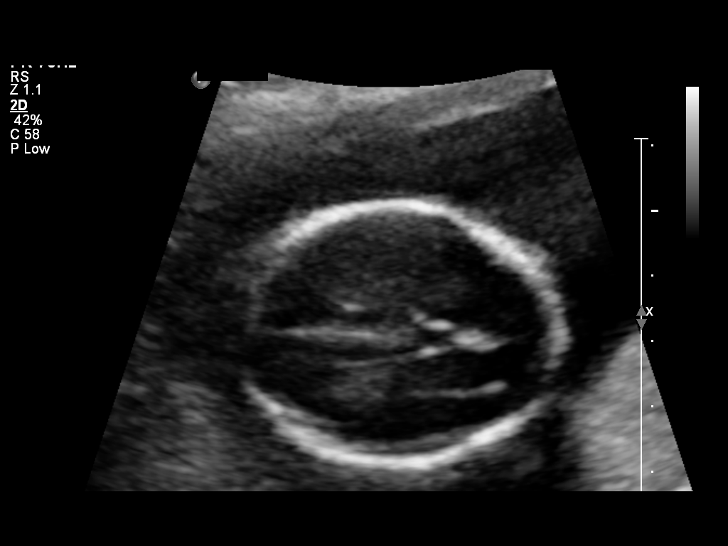
[im 34/62]
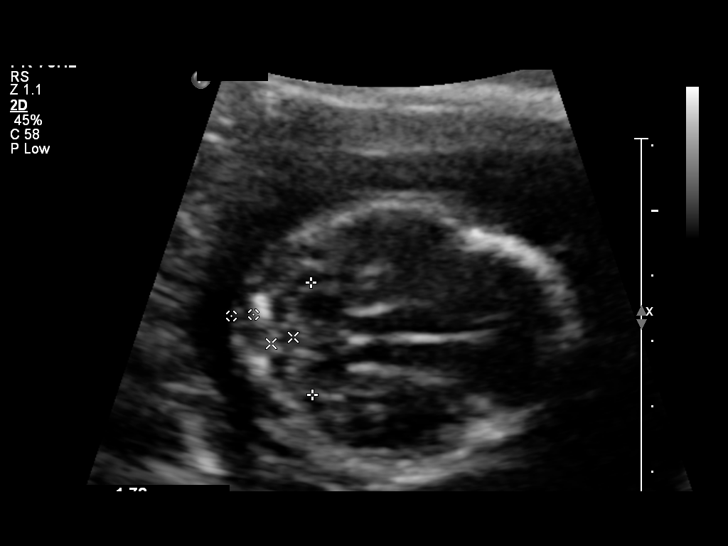
[im 39/62]
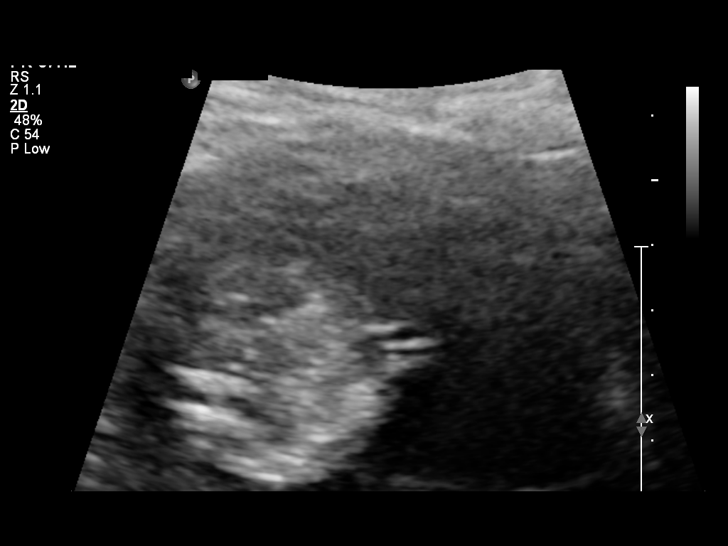
[im 43/62]
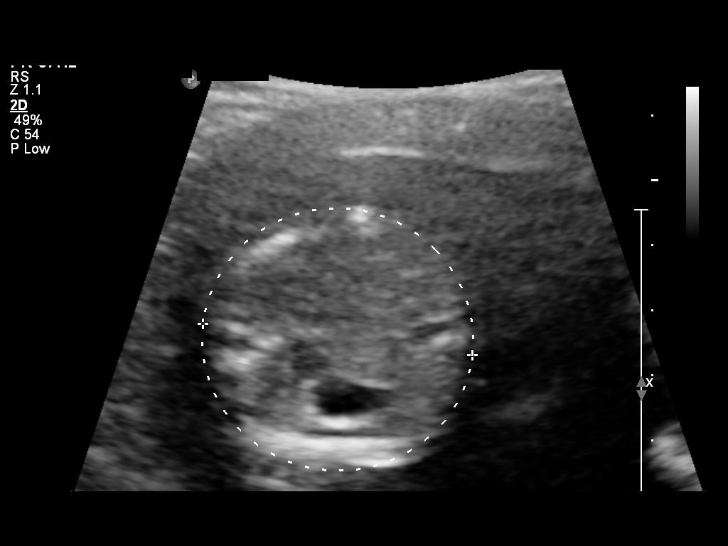
[im 50/62]
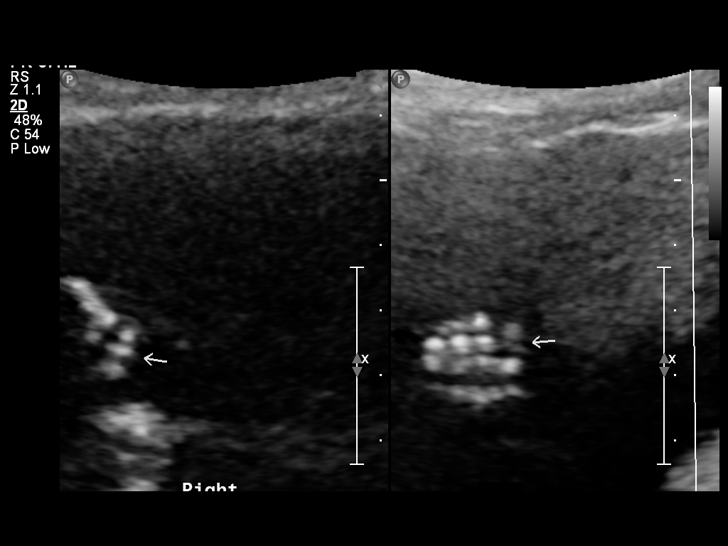
[im 55/62]
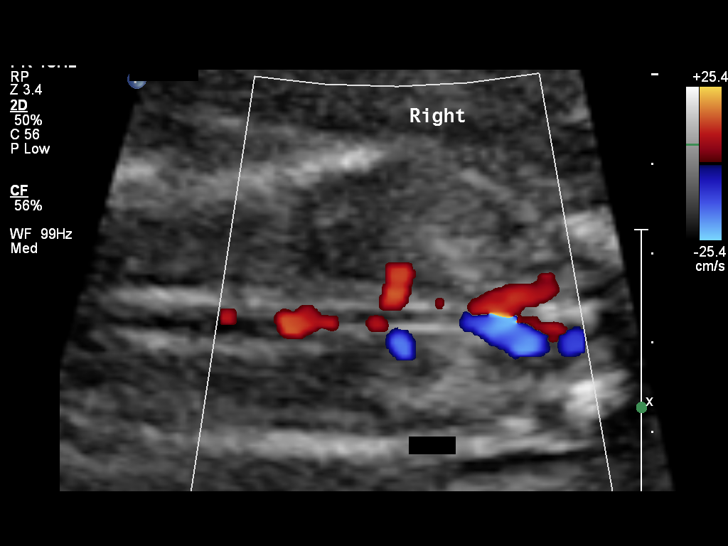
[im 59/62]
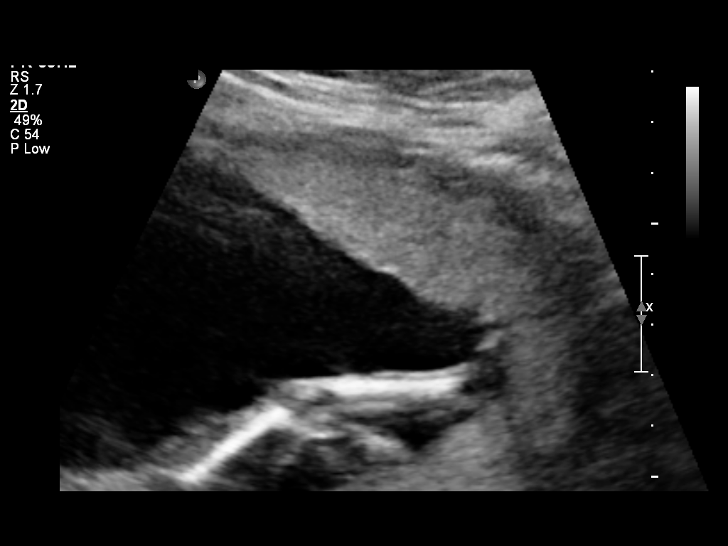

[12 of 28 positions shown; findings below may reference images not displayed]

OBSTETRICS REPORT
                      (Signed Final 09/22/2011 [DATE])

 Order#:         [id number]
Procedures

 US OB DETAIL + 14 WK                                  76811.0
Indications

 Obesity
 Detailed fetal anatomic survey
Fetal Evaluation

 Fetal Heart Rate:  155                          bpm
 Cardiac Activity:  Observed
 Presentation:      Breech
 Placenta:          Posterior, above cervical
                    os
 P. Cord            Visualized, central
 Insertion:

 Amniotic Fluid
 AFI FV:      Subjectively within normal limits
                                             Larg Pckt:     4.8  cm
Biometry

 BPD:     40.5  mm     G. Age:  18w 2d                CI:        76.14   70 - 86
                                                      FL/HC:      16.9   15.8 -
                                                                         18
 HC:     147.1  mm     G. Age:  17w 6d       42  %    HC/AC:      1.14   1.07 -

 AC:     128.8  mm     G. Age:  18w 3d       67  %    FL/BPD:
 FL:      24.9  mm     G. Age:  17w 4d       33  %    FL/AC:      19.3   20 - 24
 HUM:     24.7  mm     G. Age:  17w 5d       51  %
 CER:     17.2  mm     G. Age:  17w 1d       35  %
 NFT:     3.38  mm

 Est. FW:     219  gm      0 lb 8 oz     52  %
Gestational Age

 LMP:           17w 6d        Date:  05/20/11                 EDD:   02/24/12
 U/S Today:     18w 0d                                        EDD:   02/23/12
 Best:          17w 6d     Det. By:  LMP  (05/20/11)          EDD:   02/24/12
Anatomy
 Cranium:           Appears normal      Aortic Arch:       Not well
                                                           visualized
 Fetal Cavum:       Appears normal      Ductal Arch:       Not well
                                                           visualized
 Ventricles:        Appears normal      Diaphragm:         Not well
                                                           visualized
 Choroid Plexus:    Not well            Stomach:           Appears
                    visualized                             normal, left
                                                           sided
 Cerebellum:        Appears normal      Abdomen:           Appears normal
 Posterior Fossa:   Appears normal      Abdominal Wall:    Appears nml
                                                           (cord insert,
                                                           abd wall)
 Nuchal Fold:       Appears normal      Cord Vessels:      Appears normal
                    (neck, nuchal                          (3 vessel cord)
                    fold)
 Face:              Orbits and          Kidneys:           Not well
                    profile appear                         visualized
                    normal
 Heart:             Not well            Bladder:           Appears normal
                    visualized
 RVOT:              Not well            Spine:             Not well
                    visualized                             visualized
 LVOT:              Not well            Limbs:             Appears normal
                    visualized                             (hands, ankles,
                                                           feet)

 Other:     Fetus appears to be a female. Heels and 5th digit
            visualized. Technically difficult due to  maternal habitus
            and early GA.
Targeted Anatomy

 Fetal Central Nervous System
 Lat. Ventricles:    7.0                Cisterna Magna:
Cervix Uterus Adnexa

 Cervical Length:    3.78     cm

 Cervix:       Normal appearance by transabdominal scan.
 Uterus:       No abnormality visualized.
 Cul De Sac:   No free fluid seen.

 Left Ovary:    Not visualized.
 Right Ovary:   Within normal limits.
 Adnexa:     No abnormality visualized.
Impression

 Single living intrauterine gestation with concordant
 gestational age and normal visualized anatomy.  The fetal
 heart and spine are incompletely seen today, so follow up
 ultrasound in 2 weeks is recommended.  US modified
 Trisomy 21 risk calculation can be performed when anatomy
 scan is completed.

 questions or concerns.

## 2013-11-13 ENCOUNTER — Ambulatory Visit: Payer: Self-pay | Attending: Internal Medicine

## 2014-05-06 ENCOUNTER — Encounter: Payer: Self-pay | Admitting: Internal Medicine

## 2014-05-06 ENCOUNTER — Ambulatory Visit: Payer: Self-pay | Attending: Internal Medicine | Admitting: Internal Medicine

## 2014-05-06 VITALS — BP 129/69 | HR 71 | Temp 98.7°F | Resp 16 | Ht 62.0 in | Wt 271.0 lb

## 2014-05-06 DIAGNOSIS — Z0000 Encounter for general adult medical examination without abnormal findings: Secondary | ICD-10-CM

## 2014-05-06 DIAGNOSIS — J309 Allergic rhinitis, unspecified: Secondary | ICD-10-CM | POA: Insufficient documentation

## 2014-05-06 DIAGNOSIS — J069 Acute upper respiratory infection, unspecified: Secondary | ICD-10-CM | POA: Insufficient documentation

## 2014-05-06 LAB — POCT GLYCOSYLATED HEMOGLOBIN (HGB A1C): Hemoglobin A1C: 5.7

## 2014-05-06 LAB — GLUCOSE, POCT (MANUAL RESULT ENTRY): POC Glucose: 99 mg/dl (ref 70–99)

## 2014-05-06 MED ORDER — FLUTICASONE PROPIONATE 50 MCG/ACT NA SUSP: 2.0000 | Freq: Every day | NASAL | Status: DC

## 2014-05-06 MED ORDER — LORATADINE 10 MG PO TABS: 10.0000 mg | ORAL_TABLET | Freq: Every day | ORAL | Status: DC

## 2014-05-06 MED ORDER — AMOXICILLIN-POT CLAVULANATE 875-125 MG PO TABS: 1.0000 | ORAL_TABLET | Freq: Two times a day (BID) | ORAL | Status: DC

## 2014-05-06 NOTE — Progress Notes (Signed)
Patient ID: Cheryl Kelley, female   DOB: 1976/04/27, 38 y.o.   MRN: [id number] [id number]  [id number]  DOB - 1976/04/27  CC:  Chief Complaint  Patient presents with  . Establish Care  . Allergic Rhinitis   . Sinusitis       HPI: Cheryl Kelley is a 38 y.o. female here today to establish medical care.  Patient reports that she has had right ear pain with buzzing noise and bloody discharge from ear for over one month. She reports cough, nasal congestion, runny nose, white mucous congestion, fevers, and wheezing.  She has tried tylenol and aspirin for the pain and is currently using allegra.     No Known Allergies Past Medical History  Diagnosis Date  . Gestational diabetes     was on glyburide  . Obesity 04/04/2012  . History of gestational diabetes 04/04/2012   Current Outpatient Prescriptions on File Prior to Visit  Medication Sig Dispense Refill  . prenatal vitamin w/FE, FA (PRENATAL 1 + 1) 27-1 MG TABS Take 1 tablet by mouth daily.      . [DISCONTINUED] glyBURIDE (DIABETA) 2.5 MG tablet Take 1 tablet (2.5 mg total) by mouth at bedtime.  30 tablet  0   No current facility-administered medications on file prior to visit.   Family History  Problem Relation Age of Onset  . Diabetes Mother   . Diabetes Sister   . Diabetes Sister   . Anesthesia problems Neg Hx    History   Social History  . Marital Status: Married    Spouse Name: N/A    Number of Children: N/A  . Years of Education: N/A   Occupational History  . Not on file.   Social History Main Topics  . Smoking status: Never Smoker   . Smokeless tobacco: Never Used  . Alcohol Use: No  . Drug Use: No  . Sexual Activity: Yes    Birth Control/ Protection: None   Other Topics Concern  . Not on file   Social History Narrative  . No narrative on file    Review of Systems: See HPI   Objective:   Filed Vitals:   05/06/14 1645  BP: 129/69  Pulse: 71  Temp: 98.7 F (37.1 C)  Resp: 16    Physical Exam  Constitutional: She is oriented to person, place, and time.  HENT:  Head: Normocephalic.  Nose: Nose normal.  Bulging tympanic membrane Erythema of right ear Erythematous pharynx Frontal sinus tenderness  Eyes: Conjunctivae are normal. Pupils are equal, round, and reactive to light. Right eye exhibits no discharge. Left eye exhibits no discharge. No scleral icterus.  Neck: Normal range of motion. Neck supple.  Cardiovascular: Normal rate, regular rhythm and normal heart sounds.   Pulmonary/Chest: Effort normal and breath sounds normal.  Abdominal: Soft. Bowel sounds are normal.  Musculoskeletal: Normal range of motion.  Lymphadenopathy:    She has no cervical adenopathy.  Neurological: She is alert and oriented to person, place, and time. She has normal reflexes.  Skin: Skin is warm and dry.  Psychiatric: She has a normal mood and affect.     Lab Results  Component Value Date   WBC 11.1* 02/22/2012   HGB 13.1 02/22/2012   HCT 38.8 02/22/2012   MCV 89.6 02/22/2012   PLT 237 02/22/2012   Lab Results  Component Value Date   CREATININE 0.45 02/09/2009   BUN 12 02/09/2009   NA 141 02/09/2009   K 4.0 02/09/2009   CL 105  02/09/2009   CO2 19 02/09/2009    Lab Results  Component Value Date   HGBA1C 5.7 05/06/2014   Lipid Panel     Component Value Date/Time   CHOL 145 02/09/2009 2219   TRIG 122 02/09/2009 2219   HDL 44 02/09/2009 2219   CHOLHDL 3.3 Ratio 02/09/2009 2219   VLDL 24 02/09/2009 2219   LDLCALC 77 02/09/2009 2219       Assessment and plan:   Cheryl Kelley was seen today for establish care, allergic rhinitis  and sinusitis.  Diagnoses and associated orders for this visit:  Preventative health care - HgB A1c - Glucose (CBG) - CBC; Future - COMPLETE METABOLIC PANEL WITH GFR; Future - Lipid panel; Future - TSH; Future - Vitamin D, 25-hydroxy; Future  Acute upper respiratory infections of unspecified site - amoxicillin-clavulanate (AUGMENTIN) 875-125 MG per tablet;  Take 1 tablet by mouth 2 (two) times daily.  Allergic rhinitis - fluticasone (FLONASE) 50 MCG/ACT nasal spray; Place 2 sprays into both nostrils daily. - loratadine (CLARITIN) 10 MG tablet; Take 1 tablet (10 mg total) by mouth daily.   Return in about 1 week (around 05/13/2014) for Lab Visit.    Ambrose Finland, NP-C [facility] [phone] 05/07/2014, 4:58 PM

## 2014-05-06 NOTE — Progress Notes (Signed)
Pt here to establish care for sinus pressure with right ear pain C/o allergy sx's itchy,watery eyes unrelieved by otc Allegra,Claritinand visine Pt s/p gestational diabetes- a1c and cbg done Symptoms started x 3 dys ago Right ear inflamed  LMP 04/20/14 Spanish interpretor present

## 2014-05-12 ENCOUNTER — Other Ambulatory Visit: Payer: Self-pay

## 2014-05-23 ENCOUNTER — Ambulatory Visit: Payer: Self-pay | Attending: Internal Medicine

## 2014-08-27 ENCOUNTER — Ambulatory Visit: Payer: Self-pay | Attending: Internal Medicine

## 2014-10-06 ENCOUNTER — Encounter: Payer: Self-pay | Admitting: Internal Medicine

## 2015-01-02 ENCOUNTER — Ambulatory Visit: Payer: Self-pay | Attending: Internal Medicine

## 2015-06-25 ENCOUNTER — Ambulatory Visit (INDEPENDENT_AMBULATORY_CARE_PROVIDER_SITE_OTHER): Payer: No Typology Code available for payment source | Admitting: Podiatry

## 2015-06-25 ENCOUNTER — Encounter: Payer: Self-pay | Admitting: Podiatry

## 2015-06-25 DIAGNOSIS — L600 Ingrowing nail: Secondary | ICD-10-CM

## 2015-06-25 MED ORDER — NEOMYCIN-POLYMYXIN-HC 3.5-10000-1 OT SOLN: OTIC | Status: DC

## 2015-06-25 NOTE — Patient Instructions (Signed)

## 2015-06-25 NOTE — Progress Notes (Signed)
   Subjective:    Patient ID: Cheryl Kelley, female    DOB: 1976/03/24, 39 y.o.   MRN: [id number]  HPI  PT STATED RT FOOT GREAT TOENAIL IS BEEN PAINFUL FOR LESS THAN A MONTH. THE TOENAIL IS GETTING WORSE ESPECIALLY WHEN PUTTING PRESSURE ON IT. TRIED TO SOAK WITH EPSOM SALT, USED NEOSPORIN, AND TRIM BUT NO HELP.  Review of Systems  All other systems reviewed and are negative.      Objective:   Physical Exam: She presents today with an interpreter complaining of a painful ingrown nail to the tibial and fibular border of the hallux right. She states that is been going on for proximally one month now seems to be getting worse. She has tried soaking it and Neosporin. She states that she's tried trimming it that it only made it worse. I have reviewed her past medical history medications allergies surgery social history and review of systems area pulses are strongly palpable. Neurologic sensorium is intact per Semmes-Weinstein monofilament and deep tendon reflexes are intact muscle strength is 5 over 5 dorsiflexion plantar flexors and inverters everters all into the musculature is intact. Orthopedic evaluation demonstrates all joints distal to the ankle range of motion without crepitation. Cutaneous evaluation of straight supple well-hydrated cutis sharp incurvated nail margins along the tibiofibular border of the hallux right with mild erythema. No granulation tissue no purulence no malodor.          Assessment & Plan:  Assessment: Ingrown toenail paronychia abscess hallux right.  Plan: Discussed the etiology pathology conservative versus surgical therapies with her through the interpreter. At this point we agreed upon matrixectomy's along the tibial and fibular border of the hallux right. She tolerated this well after local anesthesia was administered. Chemical matrixectomy was performed and she was given both oral and written home-going instructions which were reviewed by her interpreter. I  will follow-up with her in 1-2 weeks for reevaluation of her ingrown nail and matrixectomy site. She was also provided with a prescription for Cortisporin otic to be applied twice daily after soaking followed by application of a Band-Aid.

## 2015-07-09 ENCOUNTER — Ambulatory Visit (INDEPENDENT_AMBULATORY_CARE_PROVIDER_SITE_OTHER): Payer: No Typology Code available for payment source | Admitting: Podiatry

## 2015-07-09 ENCOUNTER — Encounter: Payer: Self-pay | Admitting: Podiatry

## 2015-07-09 VITALS — BP 149/82 | HR 79 | Resp 12

## 2015-07-09 DIAGNOSIS — L600 Ingrowing nail: Secondary | ICD-10-CM

## 2015-07-09 NOTE — Progress Notes (Signed)
This patient presents today for follow-up matrixectomy. They continue to soak twice daily and apply Cortisporin otic as directed. Relating no complaints.  Objective: Vital signs are stable. Secondly site appears to be healing well without erythema or drainage purulence or odor.  Assessment: Well-healing surgical matrixectomy without complications.  Plan: Currently we will discontinue the use of Betadine soaks and start with Epsom salts and water twice daily. Continue the use of Cortisporin Otic and covered during the day leaving it open at night time. They will continue to soak the toe until completely well. They will continue to watch the toe for signs and symptoms of infection should any arise we will be notified immediately. Follow-up when necessary.  

## 2019-01-28 ENCOUNTER — Other Ambulatory Visit: Payer: Self-pay | Admitting: *Deleted

## 2019-01-28 DIAGNOSIS — Z124 Encounter for screening for malignant neoplasm of cervix: Secondary | ICD-10-CM

## 2019-01-29 NOTE — Progress Notes (Signed)
Patient: Cheryl Kelley           Date of Birth: [birth year]           MRN: [id number] Visit Date: 01/28/2019 PCP: No primary care provider on file.  Cervical Cancer Screening Do you smoke?: No Have you ever had or been told you have an allergy to latex products?: No Marital status: Single Date of last pap smear: 2-5 yrs ago Date of last menstrual period: 11/23/18 Number of pregnancies: 5(5) Number of births: 5 Have you ever had any of the following? Hysterectomy: No Tubal ligation (tubes tied): No Abnormal bleeding: No Abnormal pap smear: No Venereal warts: No A sex partner with venereal warts: No A high risk* sex partner: No  Cervical Exam Exam not completed.  Patient's History Patient Active Problem List   Diagnosis Date Noted  . Obesity 04/04/2012  . History of gestational diabetes 04/04/2012   Past Medical History:  Diagnosis Date  . Gestational diabetes    was on glyburide  . History of gestational diabetes 04/04/2012  . Obesity 04/04/2012    Family History  Problem Relation Age of Onset  . Diabetes Mother   . Diabetes Sister   . Diabetes Sister   . Anesthesia problems Neg Hx     Social History   Occupational History  . Not on file  Tobacco Use  . Smoking status: Never Smoker  . Smokeless tobacco: Never Used  Substance and Sexual Activity  . Alcohol use: No  . Drug use: No  . Sexual activity: Yes    Birth control/protection: None   Patient examined by [employer], ANP. Screening notes scanned into Epic.

## 2019-02-01 LAB — CYTOLOGY - PAP: Diagnosis: NEGATIVE

## 2019-03-04 ENCOUNTER — Telehealth (HOSPITAL_COMMUNITY): Payer: Self-pay | Admitting: *Deleted

## 2019-03-04 NOTE — Telephone Encounter (Signed)
Normal Pap smear result letter mailed to patient by Cytology. 

## 2020-10-10 ENCOUNTER — Ambulatory Visit (HOSPITAL_COMMUNITY)
Admission: EM | Admit: 2020-10-10 | Discharge: 2020-10-10 | Disposition: A | Discharge status: Home / Self Care | Payer: HRSA Program

## 2020-10-10 ENCOUNTER — Other Ambulatory Visit: Payer: Self-pay

## 2020-10-10 ENCOUNTER — Emergency Department (HOSPITAL_COMMUNITY): Payer: HRSA Program

## 2020-10-10 ENCOUNTER — Encounter (HOSPITAL_COMMUNITY): Payer: Self-pay

## 2020-10-10 ENCOUNTER — Inpatient Hospital Stay (HOSPITAL_COMMUNITY)
Admission: EM | Admit: 2020-10-10 | Discharge: 2020-10-18 | DRG: 177 | Disposition: A | Discharge status: Home / Self Care | Payer: HRSA Program | Attending: Family Medicine | Admitting: Family Medicine

## 2020-10-10 DIAGNOSIS — U071 COVID-19: Secondary | ICD-10-CM | POA: Diagnosis not present

## 2020-10-10 DIAGNOSIS — M6088 Other myositis, other site: Secondary | ICD-10-CM | POA: Diagnosis present

## 2020-10-10 DIAGNOSIS — E119 Type 2 diabetes mellitus without complications: Secondary | ICD-10-CM

## 2020-10-10 DIAGNOSIS — J1282 Pneumonia due to coronavirus disease 2019: Secondary | ICD-10-CM | POA: Diagnosis not present

## 2020-10-10 DIAGNOSIS — Z8632 Personal history of gestational diabetes: Secondary | ICD-10-CM

## 2020-10-10 DIAGNOSIS — E1165 Type 2 diabetes mellitus with hyperglycemia: Secondary | ICD-10-CM | POA: Diagnosis present

## 2020-10-10 DIAGNOSIS — Z833 Family history of diabetes mellitus: Secondary | ICD-10-CM

## 2020-10-10 DIAGNOSIS — Z79899 Other long term (current) drug therapy: Secondary | ICD-10-CM

## 2020-10-10 DIAGNOSIS — J9601 Acute respiratory failure with hypoxia: Secondary | ICD-10-CM | POA: Diagnosis present

## 2020-10-10 DIAGNOSIS — Z6841 Body Mass Index (BMI) 40.0 and over, adult: Secondary | ICD-10-CM

## 2020-10-10 LAB — CBC WITH DIFFERENTIAL/PLATELET
Abs Immature Granulocytes: 0 10*3/uL (ref 0.00–0.07)
Basophils Absolute: 0 10*3/uL (ref 0.0–0.1)
Basophils Relative: 0 %
Eosinophils Absolute: 0 10*3/uL (ref 0.0–0.5)
Eosinophils Relative: 0 %
HCT: 43.9 % (ref 36.0–46.0)
Hemoglobin: 13.9 g/dL (ref 12.0–15.0)
Lymphocytes Relative: 22 %
Lymphs Abs: 1.3 10*3/uL (ref 0.7–4.0)
MCH: 28.1 pg (ref 26.0–34.0)
MCHC: 31.7 g/dL (ref 30.0–36.0)
MCV: 88.7 fL (ref 80.0–100.0)
Monocytes Absolute: 0.5 10*3/uL (ref 0.1–1.0)
Monocytes Relative: 8 %
Neutro Abs: 4.1 10*3/uL (ref 1.7–7.7)
Neutrophils Relative %: 70 %
Platelets: 262 10*3/uL (ref 150–400)
RBC: 4.95 MIL/uL (ref 3.87–5.11)
RDW: 14.1 % (ref 11.5–15.5)
WBC: 5.9 10*3/uL (ref 4.0–10.5)
nRBC: 0 % (ref 0.0–0.2)

## 2020-10-10 LAB — COMPREHENSIVE METABOLIC PANEL
ALT: 48 U/L — ABNORMAL HIGH (ref 0–44)
AST: 79 U/L — ABNORMAL HIGH (ref 15–41)
Albumin: 2.7 g/dL — ABNORMAL LOW (ref 3.5–5.0)
Alkaline Phosphatase: 68 U/L (ref 38–126)
Anion gap: 12 (ref 5–15)
BUN: 6 mg/dL (ref 6–20)
CO2: 21 mmol/L — ABNORMAL LOW (ref 22–32)
Calcium: 8.2 mg/dL — ABNORMAL LOW (ref 8.9–10.3)
Chloride: 106 mmol/L (ref 98–111)
Creatinine, Ser: 0.43 mg/dL — ABNORMAL LOW (ref 0.44–1.00)
GFR, Estimated: >60 mL/min (ref >60)
Glucose, Bld: 127 mg/dL — ABNORMAL HIGH (ref 70–99)
Potassium: 3.9 mmol/L (ref 3.5–5.1)
Sodium: 139 mmol/L (ref 135–145)
Total Bilirubin: 0.4 mg/dL (ref 0.3–1.2)
Total Protein: 7.1 g/dL (ref 6.5–8.1)

## 2020-10-10 LAB — FERRITIN: Ferritin: 164 ng/mL (ref 11–307)

## 2020-10-10 LAB — GLUCOSE, CAPILLARY
Glucose-Capillary: 154 mg/dL — ABNORMAL HIGH (ref 70–99)
Glucose-Capillary: 171 mg/dL — ABNORMAL HIGH (ref 70–99)

## 2020-10-10 LAB — LACTATE DEHYDROGENASE: LDH: 428 U/L — ABNORMAL HIGH (ref 98–192)

## 2020-10-10 LAB — D-DIMER, QUANTITATIVE: D-Dimer, Quant: 1.59 ug/mL-FEU — ABNORMAL HIGH (ref 0.00–0.50)

## 2020-10-10 LAB — TRIGLYCERIDES: Triglycerides: 93 mg/dL (ref <150)

## 2020-10-10 LAB — FIBRINOGEN: Fibrinogen: 502 mg/dL — ABNORMAL HIGH (ref 210–475)

## 2020-10-10 LAB — RESPIRATORY PANEL BY RT PCR (FLU A&B, COVID)
Influenza A by PCR: NEGATIVE
Influenza B by PCR: NEGATIVE
SARS Coronavirus 2 by RT PCR: POSITIVE — AB

## 2020-10-10 LAB — PROCALCITONIN: Procalcitonin: <0.1 ng/mL

## 2020-10-10 LAB — C-REACTIVE PROTEIN: CRP: 5.5 mg/dL — ABNORMAL HIGH (ref <1.0)

## 2020-10-10 LAB — LACTIC ACID, PLASMA: Lactic Acid, Venous: 1.4 mmol/L (ref 0.5–1.9)

## 2020-10-10 MED ORDER — ENOXAPARIN SODIUM 80 MG/0.8ML ~~LOC~~ SOLN
65.0000 mg | SUBCUTANEOUS | Status: DC
  Administered 2020-10-10 – 2020-10-17 (×8): 65 mg via SUBCUTANEOUS
  Filled 2020-10-10: qty 0.8
  Filled 2020-10-10: qty 0.65
  Filled 2020-10-10 (×7): qty 0.8

## 2020-10-10 MED ORDER — SODIUM CHLORIDE 0.9 % IV SOLN
200.0000 mg | Freq: Once | INTRAVENOUS | Status: AC
  Administered 2020-10-10: 200 mg via INTRAVENOUS
  Filled 2020-10-10: qty 40

## 2020-10-10 MED ORDER — DEXAMETHASONE SODIUM PHOSPHATE 10 MG/ML IJ SOLN
6.0000 mg | Freq: Once | INTRAMUSCULAR | Status: AC
  Administered 2020-10-10: 6 mg via INTRAVENOUS
  Filled 2020-10-10: qty 1

## 2020-10-10 MED ORDER — DEXAMETHASONE 6 MG PO TABS
6.0000 mg | ORAL_TABLET | Freq: Every day | ORAL | Status: DC
  Administered 2020-10-11 – 2020-10-18 (×8): 6 mg via ORAL
  Filled 2020-10-10 (×8): qty 1

## 2020-10-10 MED ORDER — BENZONATATE 100 MG PO CAPS
200.0000 mg | ORAL_CAPSULE | Freq: Three times a day (TID) | ORAL | Status: DC | PRN
  Administered 2020-10-10 – 2020-10-12 (×5): 200 mg via ORAL
  Filled 2020-10-10 (×8): qty 2

## 2020-10-10 MED ORDER — SODIUM CHLORIDE 0.9 % IV SOLN
100.0000 mg | Freq: Every day | INTRAVENOUS | Status: AC
  Administered 2020-10-11 – 2020-10-14 (×4): 100 mg via INTRAVENOUS
  Filled 2020-10-10 (×4): qty 20

## 2020-10-10 NOTE — H&P (Addendum)
Family Medicine [facility] Admission History and Physical Service Pager: [phone]  Patient name: Cheryl Kelley Medical record number: [id number] Date of birth: 10/08/1976 Age: 44 y.o. Gender: female  Primary Care Provider: Patient, No Pcp Per Consultants: None Code Status: FULL  Preferred Emergency Contact: Patient's Son Cheryl Kelley, [phone])  Chief Complaint: SOB  Assessment and Plan: Cheryl Kelley is a 44 y.o. female presenting with dyspnea and cough. PMH is significant for obesity and gestational diabetes.  Acute Hypoxic Respiratory Failure 2/2 COVID-19 Patient presents with 2 weeks of cough and SOB that acutely worsened in the past 2 days. She reportedly tested positive for COVID-19 on 10/03/2020 at [facility] and was confirmed COVID+ on admission. Patient is unvaccinated against COVID. Today she presented to an Urgent Care where she was found to be hypoxic to 80% on room air, tachypneic and unable to speak in full sentences. In the ED she was afebrile with normal HR and stable BP, but tachypneic with RR in the 30s and hypoxic requiring escalation of respiratory support. Patient currently on 15L nonrebreather and 10L [place] mask with normal RR and O2 sats >90%. Lab workup thus far: CBC wnl, CMP remarkable for AST/ALT 79/48, CRP 5.5, D-Dimer 1.59, Fibrinogen 502, LDH 428, Ferritin 164, LA 1.4, procal <0.10. CXR showed bilateral pulmonary infiltrates consistent with multifocal pneumonia from COVID-19. -Admit to FPTS, attending Dr. Pollie Meyer -Vitals per routine, OOB with assistance only, regular diet -Airborne and contact precautions -Close monitoring of respiratory status -Supplemental O2 as needed, Goal SpO2 >90% -Continuous pulse ox -Remdesevir and Decadron daily (11/6- ) -Trend D-dimer and CRP daily per protocol -Daily CBC and CMP -Tessalon 200mg  TID prn for cough -PT/OT eval and treat  Hyperglycemia Patient with history of gestational diabetes but no  formal diagnosis of diabetes mellitus. -Monitoring blood sugars with morning BMPs -Hemoglobin A1c  FEN/GI: Regular diet Prophylaxis: Lovenox  Disposition: med-surg  History of Present Illness:  Cheryl Kelley is a 44 y.o. female presenting with SOB. Patient reports 2 weeks of fatigue and dyspnea. She tested positive for COVID-19 on 10/03/2020 at [facility]. States her cough has been significantly worse over past 5 days and she's had difficulty breathing over past 2 days. Today she presented to urgent care and was sent to the ED due to tachypnea and O2 sat 80% on room air. Patient denies fever, chest pain, abdominal pain, N/V/D. She endorses loss of taste and smell but states it's coming back now.   Review Of Systems: Per HPI with the following additions:   Review of Systems  Constitutional: Positive for fatigue. Negative for fever.  HENT: Negative for rhinorrhea.   Respiratory: Positive for cough and shortness of breath.   Cardiovascular: Negative for chest pain.  Gastrointestinal: Negative for abdominal pain, diarrhea, nausea and vomiting.  Neurological: Negative for headaches.     Patient Active Problem List   Diagnosis Date Noted  . COVID-19 10/10/2020  . Obesity 04/04/2012  . History of gestational diabetes 04/04/2012    Past Medical History: Past Medical History:  Diagnosis Date  . Gestational diabetes    was on glyburide  . History of gestational diabetes 04/04/2012  . Obesity 04/04/2012    Past Surgical History: Past Surgical History:  Procedure Laterality Date  . BACK SURGERY    . NO PAST SURGERIES      Social History: Social History   Tobacco Use  . Smoking status: Never Smoker  . Smokeless tobacco: Never Used  Substance Use Topics  . Alcohol use: No  .  Drug use: No    Family History: Family History  Problem Relation Age of Onset  . Diabetes Mother   . Diabetes Sister   . Diabetes Sister   . Anesthesia problems Neg Hx     Allergies and  Medications: No Known Allergies No current facility-administered medications on file prior to encounter.   Current Outpatient Medications on File Prior to Encounter  Medication Sig Dispense Refill  . loratadine (CLARITIN) 10 MG tablet Take 1 tablet (10 mg total) by mouth daily. 30 tablet 11  . neomycin-polymyxin-hydrocortisone (CORTISPORIN) otic solution Apply one to two drops to toe after soaking twice daily. 10 mL 0  . [DISCONTINUED] glyBURIDE (DIABETA) 2.5 MG tablet Take 1 tablet (2.5 mg total) by mouth at bedtime. 30 tablet 0    Objective: BP (!) 131/91 (BP Location: Left Arm)   Pulse 88   Temp 98.9 F (37.2 C) (Oral)   Resp (!) 34   Ht 5\' 1"  (1.549 m)   Wt 136.1 kg   SpO2 90%   BMI 56.68 kg/m  Exam: General: alert, non-toxic appearing, no acute distress ENTM: moist mucous membranes Neck: supple Cardiovascular: RRR, normal S1/S2 without m/r/g although limited by body habitus Respiratory: normal WOB on 10L nonrebreather + 15L [place], able to speak in full sentences, diffusely coarse breath sounds with inspiratory crackles and scattered upper airway wheezes Gastrointestinal: +BS, soft, nontender MSK: no peripheral edema Derm: no rashes noted Neuro: grossly intact Psych: normal affect, normal speech  Labs and Imaging: CBC BMET  Recent Labs  Lab 10/10/20 1302  WBC 5.9  HGB 13.9  HCT 43.9  PLT 262   Recent Labs  Lab 10/10/20 1302  NA 139  K 3.9  CL 106  CO2 21*  BUN 6  CREATININE 0.43*  GLUCOSE 127*  CALCIUM 8.2*    Results for MILYNN, QUIRION (MRN Charma Igo) as of 10/10/2020 14:52  Ref. Range 10/10/2020 13:02  D-Dimer, Quant Latest Ref Range: 0.00 - 0.50 ug/mL-FEU 1.59 (H)  Fibrinogen Latest Ref Range: 210 - 475 mg/dL 13/05/2020 (H)    EKG: NSR at 100bpm, normal axis, normal intervals, no ST-T changes   [phone], MD 10/10/2020, 2:22 PM PGY-1, [facility] FPTS Intern pager: [phone], text pages welcome   FPTS Upper-Level Resident  Addendum   I have independently interviewed and examined the patient. I have discussed the above with the original author and agree with their documentation. My edits for correction/addition/clarification are in blue.Please see also any attending notes.   [phone], MD PGY-2, [facility] 10/10/2020 4:28 PM  FPTS Service pager: [phone] (text pages welcome through [facility])

## 2020-10-10 NOTE — ED Notes (Signed)
Pts son, Kelby Fam, would like an update [phone]

## 2020-10-10 NOTE — ED Provider Notes (Signed)
[facility]   MRN: [id number] DOB: 1976-07-02  Subjective:   Cheryl Kelley is a 44 y.o. female presenting for 2-week history of persistent and worsening fatigue, malaise, shortness of breath.  Patient tested positive for COVID-19 on 10/03/2020 at a [facility] clinic.  She has since gotten worse.  Has been using over-the-counter medications with minimal relief.  Denies history of asthma, respiratory disorder.  She is not a smoker.  Denies history of diabetes, chronic conditions.  Does not take any chronic medications.  Has had some intermittent chest pain but not today.   No Known Allergies  Past Medical History:  Diagnosis Date  . Gestational diabetes    was on glyburide  . History of gestational diabetes 04/04/2012  . Obesity 04/04/2012     Past Surgical History:  Procedure Laterality Date  . BACK SURGERY    . NO PAST SURGERIES      Family History  Problem Relation Age of Onset  . Diabetes Mother   . Diabetes Sister   . Diabetes Sister   . Anesthesia problems Neg Hx     Social History   Tobacco Use  . Smoking status: Never Smoker  . Smokeless tobacco: Never Used  Substance Use Topics  . Alcohol use: No  . Drug use: No    ROS   Objective:   Vitals: BP 122/85 (BP Location: Left Wrist)   Pulse 97   Temp 98.9 F (37.2 C) (Oral)   Resp (!) 26   SpO2 98%   Patient was tachypneic at 45 RR and hypoxic at 80% on room air.  Patient is not able to ambulate without feeling significant Worsening of her shortness of breath.  She was placed on 6 L of oxygen and her respirations improved to 34 RR, pulse oximetry came up to 97%.  Physical Exam Constitutional:      General: She is not in acute distress.    Appearance: Normal appearance. She is well-developed. She is not ill-appearing, toxic-appearing or diaphoretic.  HENT:     Head: Normocephalic and atraumatic.     Nose: Nose normal.     Mouth/Throat:     Mouth: Mucous membranes are moist.    Eyes:     Extraocular Movements: Extraocular movements intact.     Pupils: Pupils are equal, round, and reactive to light.  Cardiovascular:     Rate and Rhythm: Normal rate and regular rhythm.     Pulses: Normal pulses.     Heart sounds: Normal heart sounds. No murmur heard.  No friction rub. No gallop.   Pulmonary:     Effort: Tachypnea present. No respiratory distress.     Breath sounds: No stridor. Decreased breath sounds present. No wheezing, rhonchi or rales.     Comments: Patient unable to speak in full sentences, pauses between each word and keeps short sentences. Skin:    General: Skin is warm and dry.     Findings: No rash.  Neurological:     Mental Status: She is alert and oriented to person, place, and time.     Comments: Lethargic and slow to respond to questions.  Psychiatric:        Mood and Affect: Mood normal.        Behavior: Behavior normal.        Thought Content: Thought content normal.     Assessment and Plan :   PDMP not reviewed this encounter.  1. Acute respiratory failure with hypoxia (HCC)  2. COVID-19     EMS was called and patient transported to another facility for further intervention and management of respiratory failure due to COVID-19.   Wallis Bamberg, PA-C 10/10/20 1134

## 2020-10-10 NOTE — Progress Notes (Signed)
   10/10/20 1733  Assess: MEWS Score  Temp 99.2 F (37.3 C)  BP 127/82  Pulse Rate 89  ECG Heart Rate 86  Resp (!) 26  SpO2 95 %  O2 Device HFNC;Non-rebreather Mask (15L HF/15L NRB)  Assess: MEWS Score  MEWS Temp 0  MEWS Systolic 0  MEWS Pulse 0  MEWS RR 2  MEWS LOC 0  MEWS Score 2  MEWS Score Color Yellow  Assess: if the MEWS score is Yellow or Red  Were vital signs taken at a resting state? Yes  Focused Assessment No change from prior assessment  Early Detection of Sepsis Score *See Row Information* Low  MEWS guidelines implemented *See Row Information* Yes  Treat  Pain Scale 0-10  Pain Score 0  Take Vital Signs  Increase Vital Sign Frequency  Yellow: Q 2hr X 2 then Q 4hr X 2, if remains yellow, continue Q 4hrs  Escalate  MEWS: Escalate Yellow: discuss with charge nurse/RN and consider discussing with provider and RRT  Notify: Charge Nurse/RN  Name of Charge Nurse/RN Notified Rodman Pickle  Date Charge Nurse/RN Notified 10/10/20  Time Charge Nurse/RN Notified 1733  Document  Progress note created (see row info) Yes

## 2020-10-10 NOTE — ED Notes (Signed)
Paged admitting to notify of pt status

## 2020-10-10 NOTE — ED Triage Notes (Signed)
Pt arrived to ED via EMS from [facility] w/ c/o worsening covid s/s. Pt tested positive on Oct 30th and is here for increased shob, cough, fatigue, fever, generalized body aches. EMS reports pt RA sat was 80%. They placed her on 6L [place] which brought her sats to 94%. They reported initial RR was 40 and after O2 application it was 30.

## 2020-10-10 NOTE — Progress Notes (Signed)
Interval progress note: Went to evaluate patient at the start of shift.  Found patient resting comfortably in bed with both nonrebreather and nasal cannula in place.  Spoke to patient via iPad Spanish interpreter.  Patient states that she feels her breathing is somewhat improved compared to earlier today.  She denies other complaints at this time.  She specifically denies any feelings of confusion or feelings of increased difficulty with breathing.  Patient was able to answer orientation questions including name, location, year without difficulty, via [place] interpreter.  Physical exam General: Patient lying in bed with nasal cannula and nonrebreather in place, not using accessory muscles for breathing Heart: Regular rate and rhythm with no murmurs appreciated Lungs: Patient speaking in full sentences, breath sounds are coarse bilaterally, some scattered expiratory wheezing. Abdomen: Bowel sounds present, no abdominal pain  Assessment: 44 year old female admitted for acute hypoxic respiratory distress due to COVID-19.  Patient continues on 10 L nonrebreather and 15 L salter.  Patient endorses feeling somewhat better than she did earlier from a breathing standpoint but is still on a significant amount of oxygen.  O2 sats 97% in the room. Plan:  Discussed with patient via interpreter that if she feels any increased difficulty in breathing that she should immediately notify nursing and that I would be happy to come back and reassess her. -We will have low threshold to get ABG and to reach out to CCM should patient's breathing status decline overnight. -We will continue to monitor.

## 2020-10-10 NOTE — ED Notes (Signed)
Ceso (pt's husband) [phone], call with updates

## 2020-10-10 NOTE — ED Notes (Signed)
EMS called, monitor and oxygen requested.   Reported to ED charge nurse, pt will go by EMS to the ED, pt hypoxic spo 81%, spo 98% on 6 L O2. Reported pt said she tested positive for COVID on 10/03/2020.

## 2020-10-10 NOTE — ED Notes (Signed)
Hospitalist providers at bedside

## 2020-10-10 NOTE — ED Provider Notes (Signed)
[facility] DE[facility]MENT Provider Note   CSN: [id number] Arrival date & time: 10/10/20  1144     History Chief Complaint  Patient presents with  . Covid Positive    Cheryl Kelley is a 44 y.o. female.  44 yo  F with a chief complaints of cough fever and shortness of breath. Going on for a couple weeks now. Had a positive Covid test at a [facility] about 7 days ago. Since then feel that her symptoms of gotten worse. Went to urgent care and was found to have an oxygen saturation in the 80s. Sent to the ED for evaluation.  The history is provided by the patient.  Illness Severity:  Moderate Onset quality:  Gradual Duration:  2 weeks Timing:  Constant Progression:  Worsening Chronicity:  New Associated symptoms: cough, fatigue, fever and shortness of breath   Associated symptoms: no chest pain, no congestion, no headaches, no myalgias, no nausea, no rhinorrhea, no vomiting and no wheezing        Past Medical History:  Diagnosis Date  . Gestational diabetes    was on glyburide  . History of gestational diabetes 04/04/2012  . Obesity 04/04/2012    Patient Active Problem List   Diagnosis Date Noted  . COVID-19 10/10/2020  . Obesity 04/04/2012  . History of gestational diabetes 04/04/2012    Past Surgical History:  Procedure Laterality Date  . BACK SURGERY    . NO PAST SURGERIES       OB History    Gravida  5   Para  5   Term  5   Preterm  0   AB  0   Living  5     SAB  0   TAB  0   Ectopic  0   Multiple  0   Live Births  1           Family History  Problem Relation Age of Onset  . Diabetes Mother   . Diabetes Sister   . Diabetes Sister   . Anesthesia problems Neg Hx     Social History   Tobacco Use  . Smoking status: Never Smoker  . Smokeless tobacco: Never Used  Substance Use Topics  . Alcohol use: No  . Drug use: No    Home Medications Prior to Admission medications   Medication Sig Start Date  End Date Taking? Authorizing Provider  albuterol (VENTOLIN HFA) 108 (90 Base) MCG/ACT inhaler Inhale 2 puffs into the lungs every 4 (four) hours as needed for wheezing or shortness of breath.   Yes [provider]  benzonatate (TESSALON) 200 MG capsule Take 200 mg by mouth in the morning, at noon, and at bedtime.    Yes [provider]  DM-Doxylamine-Acetaminophen (QC NIGHTTIME COLD & FLU PO) Take 30 mLs by mouth daily as needed (For flu symptoms).   Yes [provider]  Ibuprofen (ADVIL LIQUI-GELS MINIS) 200 MG CAPS Take 1 capsule by mouth daily as needed (For cold symptoms).   Yes [provider]  methylPREDNISolone (MEDROL) 4 MG tablet Take 4 mg by mouth 2 (two) times daily. 10/06/20  Yes [provider]  loratadine (CLARITIN) 10 MG tablet Take 1 tablet (10 mg total) by mouth daily. Patient not taking: Reported on 10/10/2020 05/06/14   Cheryl Finland, NP  neomycin-polymyxin-hydrocortisone (CORTISPORIN) otic solution Apply one to two drops to toe after soaking twice daily. Patient not taking: Reported on 10/10/2020 06/25/15   Al Corpus, [place]  T, DPM  glyBURIDE (DIABETA) 2.5 MG tablet Take 1 tablet (2.5 mg total) by mouth at bedtime. 02/06/12 02/23/12  Cheryl Kelley, Jacob J, DO    Allergies    Patient has no known allergies.  Review of Systems   Review of Systems  Constitutional: Positive for chills, fatigue and fever.  HENT: Negative for congestion and rhinorrhea.   Eyes: Negative for redness and visual disturbance.  Respiratory: Positive for cough and shortness of breath. Negative for wheezing.   Cardiovascular: Negative for chest pain and palpitations.  Gastrointestinal: Negative for nausea and vomiting.  Genitourinary: Negative for dysuria and urgency.  Musculoskeletal: Negative for arthralgias and myalgias.  Skin: Negative for pallor and wound.  Neurological: Negative for dizziness and headaches.    Physical Exam Updated Vital Signs BP 138/82   Pulse  83   Temp 98.9 F (37.2 C) (Oral)   Resp (!) 26   Ht 5\' 1"  (1.549 m)   Wt 136.1 kg   SpO2 92%   BMI 56.68 kg/m   Physical Exam Vitals and nursing note reviewed.  Constitutional:      General: She is not in acute distress.    Appearance: She is well-developed. She is obese. She is not diaphoretic.  HENT:     Head: Normocephalic and atraumatic.  Eyes:     Pupils: Pupils are equal, round, and reactive to light.  Cardiovascular:     Rate and Rhythm: Normal rate and regular rhythm.     Heart sounds: No murmur heard.  No friction rub. No gallop.   Pulmonary:     Effort: Pulmonary effort is normal. Tachypnea present.     Breath sounds: No wheezing or rales.  Abdominal:     General: There is no distension.     Palpations: Abdomen is soft.     Tenderness: There is no abdominal tenderness.  Musculoskeletal:        General: No tenderness.     Cervical back: Normal range of motion and neck supple.  Skin:    General: Skin is warm and dry.  Neurological:     Mental Status: She is alert and oriented to person, place, and time.  Psychiatric:        Behavior: Behavior normal.     ED Results / Procedures / Treatments   Labs (all labs ordered are listed, but only abnormal results are displayed) Labs Reviewed  RESPIRATORY PANEL BY RT PCR (FLU A&B, COVID) - Abnormal; Notable for the following components:      Result Value   SARS Coronavirus 2 by RT PCR POSITIVE (*)    All other components within normal limits  COMPREHENSIVE METABOLIC PANEL - Abnormal; Notable for the following components:   CO2 21 (*)    Glucose, Bld 127 (*)    Creatinine, Ser 0.43 (*)    Calcium 8.2 (*)    Albumin 2.7 (*)    AST 79 (*)    ALT 48 (*)    All other components within normal limits  D-DIMER, QUANTITATIVE (NOT AT [facility]) - Abnormal; Notable for the following components:   D-Dimer, Quant 1.59 (*)    All other components within normal limits  LACTATE DEHYDROGENASE - Abnormal; Notable for the following  components:   LDH 428 (*)    All other components within normal limits  FIBRINOGEN - Abnormal; Notable for the following components:   Fibrinogen 502 (*)    All other components within normal limits  C-REACTIVE PROTEIN - Abnormal; Notable for the  following components:   CRP 5.5 (*)    All other components within normal limits  CULTURE, BLOOD (ROUTINE X 2)  CULTURE, BLOOD (ROUTINE X 2)  LACTIC ACID, PLASMA  CBC WITH DIFFERENTIAL/PLATELET  PROCALCITONIN  FERRITIN  TRIGLYCERIDES  HIV ANTIBODY (ROUTINE TESTING W REFLEX)  I-STAT BETA HCG BLOOD, ED ([facility], [facility], [facility] ONLY)    EKG EKG Interpretation  Date/Time:  Saturday October 10 2020 12:10:59 EDT Ventricular Rate:  96 PR Interval:    QRS Duration: 118 QT Interval:  382 QTC Calculation: 483 R Axis:   3 Text Interpretation: Ventricular-paced complexes No further rhythm analysis attempted due to paced rhythm Nonspecific intraventricular conduction delay Low voltage, precordial leads No old tracing to compare Confirmed by Melene Plan [phone]) on 10/10/2020 12:24:13 PM   Radiology DG Chest Port 1 View  Result Date: 10/10/2020 CLINICAL DATA:  Shortness of breath, COVID-19 positive EXAM: PORTABLE CHEST 1 VIEW COMPARISON:  Portable exam 1205 hours without priors for comparison FINDINGS: Mild enlargement of cardiac silhouette. Mediastinal contours normal. Patchy BILATERAL airspace infiltrates consistent with multifocal pneumonia and history of COVID-19. No pleural effusion or pneumothorax. Osseous structures unremarkable. IMPRESSION: BILATERAL pulmonary infiltrates consistent with multifocal pneumonia and COVID-19. Mild enlargement of cardiac silhouette. Electronically Signed   By: Ulyses Southward M.D.   On: 10/10/2020 12:35    Procedures Procedures (including critical care time)  Medications Ordered in ED Medications  remdesivir 200 mg in sodium chloride 0.9% 250 mL IVPB (has no administration in time range)    Followed by  remdesivir 100 mg in  sodium chloride 0.9 % 100 mL IVPB (has no administration in time range)  enoxaparin (LOVENOX) injection 65 mg (has no administration in time range)  benzonatate (TESSALON) capsule 200 mg (has no administration in time range)  dexamethasone (DECADRON) injection 6 mg (6 mg Intravenous Given 10/10/20 1506)    ED Course  I have reviewed the triage vital signs and the nursing notes.  Pertinent labs & imaging results that were available during my care of the patient were reviewed by me and considered in my medical decision making (see chart for details).    MDM Rules/Calculators/A&P                          44 yo F with a chief complaints of shortness of breath and cough. [facility]id positive at a [facility] about a week ago. 2 weeks of symptoms. Hypoxic. Obtain laboratory evaluation.  Discussed with medicine for admission.  CRITICAL CARE Performed by: Rae Roam   Total critical care time: 35 minutes  Critical care time was exclusive of separately billable procedures and treating other patients.  Critical care was necessary to treat or prevent imminent or life-threatening deterioration.  Critical care was time spent personally by me on the following activities: development of treatment plan with patient and/or surrogate as well as nursing, discussions with consultants, evaluation of patient's response to treatment, examination of patient, obtaining history from patient or surrogate, ordering and performing treatments and interventions, ordering and review of laboratory studies, ordering and review of radiographic studies, pulse oximetry and re-evaluation of patient's condition.  The patients results and plan were reviewed and discussed.   Any x-rays performed were independently reviewed by myself.   Differential diagnosis were considered with the presenting HPI.  Medications  remdesivir 200 mg in sodium chloride 0.9% 250 mL IVPB (has no administration in time range)    Followed by    remdesivir  100 mg in sodium chloride 0.9 % 100 mL IVPB (has no administration in time range)  enoxaparin (LOVENOX) injection 65 mg (has no administration in time range)  benzonatate (TESSALON) capsule 200 mg (has no administration in time range)  dexamethasone (DECADRON) injection 6 mg (6 mg Intravenous Given 10/10/20 1506)    Vitals:   10/10/20 1519 10/10/20 1530 10/10/20 1545 10/10/20 1600  BP:  (!) 160/83 119/66 138/82  Pulse: 92 92 92 83  Resp: (!) 30 (!) 31 (!) 31 (!) 26  Temp:      TempSrc:      SpO2: 94% 96% 96% 92%  Weight:      Height:        Final diagnoses:  Acute respiratory failure with hypoxia (HCC)  Pneumonia due to COVID-19 virus    Admission/ observation were discussed with the admitting physician, patient and/or family and they are comfortable with the plan.    Final Clinical Impression(s) / ED Diagnoses Final diagnoses:  Acute respiratory failure with hypoxia (HCC)  Pneumonia due to COVID-19 virus    Rx / DC Orders ED Discharge Orders    None       Melene Plan, DO 10/10/20 1614

## 2020-10-11 DIAGNOSIS — M6088 Other myositis, other site: Secondary | ICD-10-CM | POA: Diagnosis present

## 2020-10-11 DIAGNOSIS — E119 Type 2 diabetes mellitus without complications: Secondary | ICD-10-CM | POA: Diagnosis not present

## 2020-10-11 DIAGNOSIS — Z8632 Personal history of gestational diabetes: Secondary | ICD-10-CM | POA: Diagnosis not present

## 2020-10-11 DIAGNOSIS — E1165 Type 2 diabetes mellitus with hyperglycemia: Secondary | ICD-10-CM | POA: Diagnosis present

## 2020-10-11 DIAGNOSIS — J9601 Acute respiratory failure with hypoxia: Secondary | ICD-10-CM | POA: Diagnosis present

## 2020-10-11 DIAGNOSIS — Z833 Family history of diabetes mellitus: Secondary | ICD-10-CM | POA: Diagnosis not present

## 2020-10-11 DIAGNOSIS — Z79899 Other long term (current) drug therapy: Secondary | ICD-10-CM | POA: Diagnosis not present

## 2020-10-11 DIAGNOSIS — U071 COVID-19: Secondary | ICD-10-CM | POA: Diagnosis present

## 2020-10-11 DIAGNOSIS — J1282 Pneumonia due to coronavirus disease 2019: Secondary | ICD-10-CM | POA: Diagnosis present

## 2020-10-11 DIAGNOSIS — Z6841 Body Mass Index (BMI) 40.0 and over, adult: Secondary | ICD-10-CM

## 2020-10-11 LAB — COMPREHENSIVE METABOLIC PANEL
ALT: 47 U/L — ABNORMAL HIGH (ref 0–44)
AST: 67 U/L — ABNORMAL HIGH (ref 15–41)
Albumin: 2.6 g/dL — ABNORMAL LOW (ref 3.5–5.0)
Alkaline Phosphatase: 66 U/L (ref 38–126)
Anion gap: 8 (ref 5–15)
BUN: 8 mg/dL (ref 6–20)
CO2: 24 mmol/L (ref 22–32)
Calcium: 8.5 mg/dL — ABNORMAL LOW (ref 8.9–10.3)
Chloride: 107 mmol/L (ref 98–111)
Creatinine, Ser: 0.54 mg/dL (ref 0.44–1.00)
GFR, Estimated: >60 mL/min (ref >60)
Glucose, Bld: 143 mg/dL — ABNORMAL HIGH (ref 70–99)
Potassium: 4.1 mmol/L (ref 3.5–5.1)
Sodium: 139 mmol/L (ref 135–145)
Total Bilirubin: 0.5 mg/dL (ref 0.3–1.2)
Total Protein: 7.1 g/dL (ref 6.5–8.1)

## 2020-10-11 LAB — CBC
HCT: 42.7 % (ref 36.0–46.0)
Hemoglobin: 13.6 g/dL (ref 12.0–15.0)
MCH: 27.4 pg (ref 26.0–34.0)
MCHC: 31.9 g/dL (ref 30.0–36.0)
MCV: 86.1 fL (ref 80.0–100.0)
Platelets: 296 10*3/uL (ref 150–400)
RBC: 4.96 MIL/uL (ref 3.87–5.11)
RDW: 13.8 % (ref 11.5–15.5)
WBC: 4.1 10*3/uL (ref 4.0–10.5)
nRBC: 0 % (ref 0.0–0.2)

## 2020-10-11 LAB — GLUCOSE, CAPILLARY
Glucose-Capillary: 139 mg/dL — ABNORMAL HIGH (ref 70–99)
Glucose-Capillary: 144 mg/dL — ABNORMAL HIGH (ref 70–99)
Glucose-Capillary: 194 mg/dL — ABNORMAL HIGH (ref 70–99)
Glucose-Capillary: 195 mg/dL — ABNORMAL HIGH (ref 70–99)

## 2020-10-11 LAB — HEMOGLOBIN A1C
Hgb A1c MFr Bld: 7.9 % — ABNORMAL HIGH (ref 4.8–5.6)
Mean Plasma Glucose: 180.03 mg/dL

## 2020-10-11 LAB — C-REACTIVE PROTEIN: CRP: 5.3 mg/dL — ABNORMAL HIGH (ref <1.0)

## 2020-10-11 LAB — D-DIMER, QUANTITATIVE: D-Dimer, Quant: 1.67 ug/mL-FEU — ABNORMAL HIGH (ref 0.00–0.50)

## 2020-10-11 LAB — HIV ANTIBODY (ROUTINE TESTING W REFLEX): HIV Screen 4th Generation wRfx: NONREACTIVE

## 2020-10-11 MED ORDER — ORAL CARE MOUTH RINSE
15.0000 mL | Freq: Two times a day (BID) | OROMUCOSAL | Status: DC
  Administered 2020-10-11 – 2020-10-18 (×15): 15 mL via OROMUCOSAL

## 2020-10-11 MED ORDER — ACETAMINOPHEN 500 MG PO TABS
1000.0000 mg | ORAL_TABLET | Freq: Four times a day (QID) | ORAL | Status: DC | PRN
  Administered 2020-10-11 – 2020-10-12 (×2): 1000 mg via ORAL
  Filled 2020-10-11 (×2): qty 2

## 2020-10-11 MED ORDER — INSULIN ASPART 100 UNIT/ML ~~LOC~~ SOLN
0.0000 [IU] | Freq: Three times a day (TID) | SUBCUTANEOUS | Status: DC
  Administered 2020-10-11: 2 [IU] via SUBCUTANEOUS
  Administered 2020-10-11: 1 [IU] via SUBCUTANEOUS
  Administered 2020-10-12 (×2): 2 [IU] via SUBCUTANEOUS
  Administered 2020-10-12: 1 [IU] via SUBCUTANEOUS
  Administered 2020-10-13: 2 [IU] via SUBCUTANEOUS
  Administered 2020-10-13: 5 [IU] via SUBCUTANEOUS
  Administered 2020-10-13 – 2020-10-14 (×4): 1 [IU] via SUBCUTANEOUS
  Administered 2020-10-15 (×2): 2 [IU] via SUBCUTANEOUS
  Administered 2020-10-16: 3 [IU] via SUBCUTANEOUS
  Administered 2020-10-16: 1 [IU] via SUBCUTANEOUS
  Administered 2020-10-17: 3 [IU] via SUBCUTANEOUS
  Administered 2020-10-17 – 2020-10-18 (×3): 1 [IU] via SUBCUTANEOUS
  Administered 2020-10-18: 2 [IU] via SUBCUTANEOUS

## 2020-10-11 NOTE — Evaluation (Signed)
Physical Therapy Evaluation Patient Details Name: Cheryl Kelley MRN: [id number] DOB: 1976-03-31 Today's Date: 10/11/2020   History of Present Illness  Cheryl Kelley is a 44 y.o. female presenting with dyspnea and cough. PMH is significant for obesity and gestational diabetes.  Presents with acute hypoxic respiratory failure due to COVID-19.  Clinical Impression  Patient presents with decreased mobility due to weakness, decreased activity tolerance with SpO2 down to 84% while on NRB @ 15L and HFNC @ 15L.  She mobilized up to [facility] with min A and stood briefly for linen change.  Seated EOB for washing her face as well.  She will benefit from skilled PT in the acute setting to allow return home with family support and follow up HHPT.  Will need further assessment for DME recommendations.  Unable to ambulate today due to O2 requirements.  PT to continue to follow.     Follow Up Recommendations Home health PT    Equipment Recommendations  Other (comment) (TBA)    Recommendations for Other Services       Precautions / Restrictions Precautions Precautions: Fall Precaution Comments: high O2 requirement      Mobility  Bed Mobility Overal bed mobility: Needs Assistance Bed Mobility: Supine to Sit;Sit to Supine     Supine to sit: Min assist;HOB elevated Sit to supine: Min assist   General bed mobility comments: assist to lift trunk up to sit, assist for guiding legs into bed to supine and for multiple lines    Transfers Overall transfer level: Needs assistance Equipment used: None Transfers: Sit to/from [employer] Sit to Stand: Min assist Stand pivot transfers: Min assist       General transfer comment: min A for balance up to [facility], then to bed, then to stand for linen change  Ambulation/Gait             General Gait Details: no ambulation due to high O2 requirement  Stairs            Wheelchair Mobility    Modified Rankin (Stroke  Patients Only)       Balance Overall balance assessment: Needs assistance   Sitting balance-Leahy Scale: Good     Standing balance support: Single extremity supported Standing balance-Leahy Scale: Poor Standing balance comment: mild unsteadiness standing so holding on with one hand to bed rail or IV pole while PT changed linen on bed                             Pertinent Vitals/Pain Pain Assessment: No/denies pain    Home Living Family/patient expects to be discharged to:: Private residence Living Arrangements: Spouse/significant other;Children;Other relatives Available Help at Discharge: Family Type of Home: Apartment       Home Layout: One level Home Equipment: None      Prior Function Level of Independence: Independent         Comments: working on [employer]        Extremity/Trunk Assessment   Upper Extremity Assessment Upper Extremity Assessment: Generalized weakness    Lower Extremity Assessment Lower Extremity Assessment: Generalized weakness       Communication   Communication: Interpreter utilized;Prefers language other than English (used video interpreter [place])  Cognition Arousal/Alertness: Awake/alert Behavior During Therapy: WFL for tasks assessed/performed Overall Cognitive Status: Within Functional Limits for tasks assessed  General Comments General comments (skin integrity, edema, etc.): SpO2 dropping to mid 80's while on NRB mask and HFNC 15 L each, but mask not staying on well and [place] removed at times due to lines not reaching well to [facility].  Patient needing assist for hygiene after toileting, had small BM in bed as well prior to getting up    Exercises     Assessment/Plan    PT Assessment Patient needs continued PT services  PT Problem List Decreased strength;Decreased mobility;Cardiopulmonary status limiting activity;Decreased  balance;Decreased activity tolerance;Decreased safety awareness       PT Treatment Interventions DME instruction;Therapeutic activities;Gait training;Therapeutic exercise;Patient/family education;Balance training;Functional mobility training    PT Goals (Current goals can be found in the Care Plan section)  Acute Rehab PT Goals Patient Stated Goal: to go home, feel better PT Goal Formulation: With patient Time For Goal Achievement: 10/25/20 Potential to Achieve Goals: Good    Frequency Min 3X/week   Barriers to discharge        Co-evaluation               AM-PAC PT "6 Clicks" Mobility  Outcome Measure Help needed turning from your back to your side while in a flat bed without using bedrails?: None Help needed moving from lying on your back to sitting on the side of a flat bed without using bedrails?: A Little Help needed moving to and from a bed to a chair (including a wheelchair)?: A Little Help needed standing up from a chair using your arms (e.g., wheelchair or bedside chair)?: A Little Help needed to walk in hospital room?: A Lot Help needed climbing 3-5 steps with a railing? : A Lot 6 Click Score: 17    End of Session Equipment Utilized During Treatment: Oxygen Activity Tolerance: Patient limited by fatigue;Treatment limited secondary to medical complications (Comment) (hypoxia, productive cough throughout mobility) Patient left: in bed;with call bell/phone within reach Nurse Communication: Mobility status PT Visit Diagnosis: Other abnormalities of gait and mobility (R26.89);Muscle weakness (generalized) (M62.81)    Time: 8099-8338 PT Time Calculation (min) (ACUTE ONLY): 32 min   Charges:   PT Evaluation $PT Eval Moderate Complexity: 1 Mod PT Treatments $Therapeutic Activity: 8-22 mins        Sheran Lawless, PT Acute Rehabilitation Services Pager:[phone] [phone] 10/11/2020   [name] 10/11/2020, 5:10 PM

## 2020-10-11 NOTE — Progress Notes (Signed)
Family Medicine Teaching Service Daily Progress Note Intern Pager: [phone]  Patient name: Elzina Rosas-Flores Medical record number: [id number] Date of birth: 1976-06-12 Age: 44 y.o. Gender: female  Primary Care Provider: Patient, No Pcp Per Consultants: None Code Status: Full  Pt Overview and Major Events to Date:  Admitted 11/6  Assessment and Plan: Carolin Rosas-Flores is a 44 y.o. female presenting with dyspnea and cough. PMH is significant for obesity and gestational diabetes.  Acute Hypoxic Respiratory Failure 2/2 COVID-19 Currently on 15L non-rebreather and 10L nasal canula.  Currently satting in the low 90's.  CRP D-dimer stable.  Patient indicates overall is feeling better and has good appetite.  Cuyrrently compliaing on lower back pain.  Likely due to discomfort from hospital bed, or myositis due to COVID. -Vitals per routine, OOB with assistance only, regular diet -Airborne and contact precautions -Close monitoring of respiratory status -Supplemental O2 as needed, Goal SpO2 >90% -Continuous pulse ox -Remdesevir and Decadron daily (11/6- ) -Trend D-dimer and CRP daily per protocol -Daily CBC and CMP -Tessalon 200mg  TID prn for cough - Start tylenol 1000 mg q6hr for back apin - PT/OT seeing patient  Hyperglycemia A1C- 7.9.  Blood sugars so far have reamined in 100's. -Monitoring blood sugars with morning BMPs -Start sSSI given A1C and decadron treatment    FEN/GI: Regular diet Prophylaxis: Lovenox  Status is: Observation  The patient will require care spanning > 2 midnights and should be moved to inpatient because: Inpatient level of care appropriate due to severity of illness  Dispo: The patient is from: Home              Anticipated d/c is to: Home              Anticipated d/c date is: 2 days              Patient currently is not medically stable to go home.     Subjective:  Patient indicates she is feeling well this morning.  Able to take non-rebreather  mask off to eat without issue.  Complains of cough and back pain.  Has difficulty breathing only when up moving around.  Objective: Temp:  [98.5 F (36.9 C)-99.9 F (37.7 C)] 99 F (37.2 C) (11/07 0723) Pulse Rate:  [71-100] 91 (11/07 0723) Resp:  [16-43] 26 (11/07 0723) BP: (104-160)/(48-97) 147/74 (11/07 0723) SpO2:  [81 %-98 %] 93 % (11/07 0723) FiO2 (%):  [60 %] 60 % (11/06 1500) Weight:  [133.5 kg-136.1 kg] 133.5 kg (11/06 1733)   Physical Exam: Physical Exam Constitutional:      General: She is not in acute distress.    Appearance: Normal appearance. She is ill-appearing.  HENT:     Head: Normocephalic and atraumatic.     Mouth/Throat:     Mouth: Mucous membranes are moist.  Cardiovascular:     Rate and Rhythm: Normal rate and regular rhythm.  Pulmonary:     Effort: Pulmonary effort is normal.     Comments: Coarse breath sounds bilaterally Abdominal:     General: Abdomen is flat. There is no distension.     Palpations: Abdomen is soft.     Tenderness: There is no abdominal tenderness.  Skin:    General: Skin is warm.  Neurological:     Mental Status: She is alert.    Laboratory: Recent Labs  Lab 10/10/20 1302 10/11/20 0305  WBC 5.9 4.1  HGB 13.9 13.6  HCT 43.9 42.7  PLT 262 296   Recent  Labs  Lab 10/10/20 1302 10/11/20 0305  NA 139 139  K 3.9 4.1  CL 106 107  CO2 21* 24  BUN 6 8  CREATININE 0.43* 0.54  CALCIUM 8.2* 8.5*  PROT 7.1 7.1  BILITOT 0.4 0.5  ALKPHOS 68 66  ALT 48* 47*  AST 79* 67*  GLUCOSE 127* 143*   CRP- 5.5 >>5.3 D-dimer- 1.59 >>1.67  Imaging/Diagnostic Tests:  EXAM: PORTABLE CHEST 1 VIEW  COMPARISON:  Portable exam 1205 hours without priors for comparison  FINDINGS: Mild enlargement of cardiac silhouette.  Mediastinal contours normal.  Patchy BILATERAL airspace infiltrates consistent with multifocal pneumonia and history of COVID-19.  No pleural effusion or pneumothorax.  Osseous structures  unremarkable.  IMPRESSION: BILATERAL pulmonary infiltrates consistent with multifocal pneumonia and COVID-19.  Mild enlargement of cardiac silhouette.  Jovita Kussmaul, MD 10/11/2020, 8:13 AM PGY-1, [facility] Family Medicine FPTS Intern pager: [phone], text pages welcome

## 2020-10-12 DIAGNOSIS — J9601 Acute respiratory failure with hypoxia: Secondary | ICD-10-CM | POA: Diagnosis not present

## 2020-10-12 DIAGNOSIS — Z6841 Body Mass Index (BMI) 40.0 and over, adult: Secondary | ICD-10-CM | POA: Diagnosis not present

## 2020-10-12 DIAGNOSIS — J1282 Pneumonia due to coronavirus disease 2019: Secondary | ICD-10-CM | POA: Diagnosis not present

## 2020-10-12 DIAGNOSIS — U071 COVID-19: Secondary | ICD-10-CM | POA: Diagnosis not present

## 2020-10-12 LAB — COMPREHENSIVE METABOLIC PANEL
ALT: 43 U/L (ref 0–44)
AST: 53 U/L — ABNORMAL HIGH (ref 15–41)
Albumin: 2.6 g/dL — ABNORMAL LOW (ref 3.5–5.0)
Alkaline Phosphatase: 60 U/L (ref 38–126)
Anion gap: 9 (ref 5–15)
BUN: 15 mg/dL (ref 6–20)
CO2: 23 mmol/L (ref 22–32)
Calcium: 8.5 mg/dL — ABNORMAL LOW (ref 8.9–10.3)
Chloride: 107 mmol/L (ref 98–111)
Creatinine, Ser: 0.54 mg/dL (ref 0.44–1.00)
GFR, Estimated: >60 mL/min (ref >60)
Glucose, Bld: 172 mg/dL — ABNORMAL HIGH (ref 70–99)
Potassium: 4.2 mmol/L (ref 3.5–5.1)
Sodium: 139 mmol/L (ref 135–145)
Total Bilirubin: 0.5 mg/dL (ref 0.3–1.2)
Total Protein: 6.8 g/dL (ref 6.5–8.1)

## 2020-10-12 LAB — CBC WITH DIFFERENTIAL/PLATELET
Abs Immature Granulocytes: 0 10*3/uL (ref 0.00–0.07)
Basophils Absolute: 0 10*3/uL (ref 0.0–0.1)
Basophils Relative: 0 %
Eosinophils Absolute: 0 10*3/uL (ref 0.0–0.5)
Eosinophils Relative: 0 %
HCT: 43.4 % (ref 36.0–46.0)
Hemoglobin: 13.8 g/dL (ref 12.0–15.0)
Lymphocytes Relative: 15 %
Lymphs Abs: 1 10*3/uL (ref 0.7–4.0)
MCH: 27.3 pg (ref 26.0–34.0)
MCHC: 31.8 g/dL (ref 30.0–36.0)
MCV: 85.9 fL (ref 80.0–100.0)
Monocytes Absolute: 0.3 10*3/uL (ref 0.1–1.0)
Monocytes Relative: 4 %
Neutro Abs: 5.4 10*3/uL (ref 1.7–7.7)
Neutrophils Relative %: 81 %
Platelets: 337 10*3/uL (ref 150–400)
RBC: 5.05 MIL/uL (ref 3.87–5.11)
RDW: 13.8 % (ref 11.5–15.5)
WBC: 6.7 10*3/uL (ref 4.0–10.5)
nRBC: 0 % (ref 0.0–0.2)
nRBC: 0 /100 WBC

## 2020-10-12 LAB — GLUCOSE, CAPILLARY
Glucose-Capillary: 143 mg/dL — ABNORMAL HIGH (ref 70–99)
Glucose-Capillary: 173 mg/dL — ABNORMAL HIGH (ref 70–99)
Glucose-Capillary: 172 mg/dL — ABNORMAL HIGH (ref 70–99)
Glucose-Capillary: 229 mg/dL — ABNORMAL HIGH (ref 70–99)

## 2020-10-12 LAB — D-DIMER, QUANTITATIVE: D-Dimer, Quant: 1.44 ug/mL-FEU — ABNORMAL HIGH (ref 0.00–0.50)

## 2020-10-12 LAB — C-REACTIVE PROTEIN: CRP: 1.9 mg/dL — ABNORMAL HIGH (ref <1.0)

## 2020-10-12 MED ORDER — SALINE SPRAY 0.65 % NA SOLN
1.0000 | NASAL | Status: DC | PRN
  Filled 2020-10-12: qty 44

## 2020-10-12 MED ORDER — LIP MEDEX EX OINT
1.0000 "application " | TOPICAL_OINTMENT | CUTANEOUS | Status: DC | PRN
  Administered 2020-10-14: 1 via TOPICAL
  Filled 2020-10-12: qty 7

## 2020-10-12 NOTE — Progress Notes (Addendum)
   10/12/20 1600  Assess: MEWS Score  Temp 97.6 F (36.4 C)  BP (!) 110/56  Pulse Rate 67  ECG Heart Rate 67  Resp (!) 26  Level of Consciousness Alert  SpO2 100 %  O2 Device HFNC (NRB 10L )  Patient Activity (if Appropriate) In chair  O2 Flow Rate (L/min) 15 L/min  Assess: MEWS Score  MEWS Temp 0  MEWS Systolic 0  MEWS Pulse 0  MEWS RR 2  MEWS LOC 0  MEWS Score 2  MEWS Score Color Yellow  Assess: if the MEWS score is Yellow or Red  Were vital signs taken at a resting state? Yes  Focused Assessment No change from prior assessment  Early Detection of Sepsis Score *See Row Information* Low  MEWS guidelines implemented *See Row Information* Yes  Treat  Pain Scale 0-10  Pain Score 0  Take Vital Signs  Increase Vital Sign Frequency  Yellow: Q 2hr X 2 then Q 4hr X 2, if remains yellow, continue Q 4hrs  Escalate  MEWS: Escalate Yellow: discuss with charge nurse/RN and consider discussing with provider and RRT  Notify: Charge Nurse/RN  Name of Charge Nurse/RN Notified Sarah, RN  Date Charge Nurse/RN Notified 10/12/20  Time Charge Nurse/RN Notified 1615  Notify: Provider  Provider Name/Title Dr. Pecola Leisure, MD  Date Provider Notified 10/12/20  Time Provider Notified [phone]  Notification Type Page  Notification Reason Other (Comment) (Mews protocol )  Response No new orders  Date of Provider Response 10/12/20  Time of Provider Response 1618 (MD read the message, no reply)   Pt resting in chair with no new complaints. MD notified per MEWS protocol. No new orders at this time.   1725: MD at bedside to assess patient. MEWS is now green.

## 2020-10-12 NOTE — Progress Notes (Addendum)
Interim progress note  Late entry, visited patient on 11/7 around 11:30 PM after receiving set up for my colleagues to please check on her overnight.  Patient was sleeping well in bed, easily aroused.  Respiratory rate time 27, satting 94% on both nasal cannula and nonrebreather.  Pulmonary auscultation revealed inspiratory and x-ray wheezes with crackles, however patient is moving air very well.   Peggyann Shoals, DO [facility] Family Medicine, PGY-3 10/12/2020 6:56 AM

## 2020-10-12 NOTE — Progress Notes (Signed)
Family Medicine Teaching Service Attending Brief Progress Note  S: Patient seen and examined. Patient reports she is doing better today. Spanish interpreter utilized during today's visit. Does note she gets anxious/short of breath any time oxygen is weaned  O: BP (!) 110/56 (BP Location: Right Arm)    Pulse 67    Temp 97.6 F (36.4 C)    Resp (!) 26    Ht 5\' 1"  (1.549 m)    Wt 133.5 kg    SpO2 100%    BMI 55.61 kg/m   Gen: no acute distress, pleasant, cooperative Resp: normal work of breathing, wearing [place] and nonrebreather, speaks in full sentences  A/P:  Acute hypoxic respiratory failure secondary to COVID-19. Stable on 25L supplemental O2. Continue dexamethasone and remdesivir. Wean O2 as able.  Will cosign resident note when it is available.  , MD [facility]

## 2020-10-12 NOTE — Plan of Care (Signed)

## 2020-10-12 NOTE — Progress Notes (Signed)
FPTS Interim Progress Note  S: Patient seen and examined at bedside. She was asleep when I arrived, denies any dyspnea. Spoke with nurse, reports that she has been doing well.   O: BP (!) 102/57 (BP Location: Right Arm)   Pulse 65   Temp 98.3 F (36.8 C) (Oral)   Resp (!) 25   Ht 5\' 1"  (1.549 m)   Wt 133.5 kg   SpO2 100%   BMI 55.61 kg/m   General: Patient sleeping and watching tv comfortably, in no acute distress. CV: RRR, no murmurs or gallops auscultated  Resp: crackles noted diffusely without focal findings, breathing comfortably on 10 L, no signs of respiratory distress Ext: radial pulses strong and equal bilaterally Neuro: easily arousable to voice, speaking clearly    A/P: -wean O2 as tolerated -continue to closely monitor respiratory status -continue with current plan   , DO 10/12/2020, 10:09 PM PGY-1, [facility] Family Medicine Service pager [phone]

## 2020-10-12 NOTE — Progress Notes (Addendum)
Occupational Therapy Evaluation Patient Details Name: Cheryl Kelley MRN: [id number] DOB: 05-Aug-1976 Today's Date: 10/12/2020    History of Present Illness Cheryl Kelley is a 44 y.o. female presenting with dyspnea and cough. PMH is significant for obesity and gestational diabetes.  Presents with acute hypoxic respiratory failure due to COVID-19.   Clinical Impression   PTA, pt was living with her husband and children and was independent working full time. Pt currently requiring Min A for UB ADLs, Mod A for LB ADLs, and Min A for stand pivot to recliner. Pt presenting with decreased activity tolerance as seen by fatigue, SOB, and decreased SpO2.  Pt SpO2 dropping to 84% on 15L HFNC and 10L NRB and RR in 30s. Pt quickly recovering to 90% with seated rest break. Pt also reporting anxiety and fear with movement due to difficulty breathing; benefits from calm environment and increased time. Pt able to complete oral care while seated with set up and SpO2 maintaining 96-94% on 15L HFNC only. Pt requesting to place NRB mask back on at end of session. Pt would benefit from further acute OT to facilitate safe dc. Recommend dc to home with HHOT for further OT to optimize safety, independence with ADLs, and return to PLOF.     Follow Up Recommendations  Home health OT;Supervision - Intermittent    Equipment Recommendations  3 in 1 bedside commode Boykin Nearing?)    Recommendations for Other Services PT consult     Precautions / Restrictions Precautions Precautions: Fall Precaution Comments: high O2 requirement      Mobility Bed Mobility Overal bed mobility: Needs Assistance Bed Mobility: Supine to Sit     Supine to sit: Min assist;HOB elevated     General bed mobility comments: Min A to hold therapist's hand and pull into upright position    Transfers Overall transfer level: Needs assistance Equipment used: 1 person hand held assist Transfers: Sit to/from [employer] Sit to Stand: Min assist Stand pivot transfers: Min assist       General transfer comment: MIn A for balance to pivot to recliner    Balance Overall balance assessment: Needs assistance Sitting-balance support: Feet supported;No upper extremity supported Sitting balance-Leahy Scale: Good     Standing balance support: During functional activity;Single extremity supported Standing balance-Leahy Scale: Fair                             ADL either performed or assessed with clinical judgement   ADL Overall ADL's : Needs assistance/impaired Eating/Feeding: Set up;Sitting   Grooming: Set up;Supervision/safety;Oral care;Sitting   Upper Body Bathing: Minimal assistance;Sitting   Lower Body Bathing: Moderate assistance;Sit to/from stand   Upper Body Dressing : Minimal assistance;Sitting   Lower Body Dressing: Moderate assistance;Sit to/from stand   Toilet Transfer: Minimal assistance;Stand-pivot           Functional mobility during ADLs: Minimal assistance (Stand pivot only) General ADL Comments: Pt presenting with decreased actvity tolerance as seen by fatigue, shortness of breathing, requires rest breaks and increased time, and decrease in SpO2 with activity.      Vision         Perception     Praxis      Pertinent Vitals/Pain Pain Assessment: No/denies pain     Hand Dominance Right   Extremity/Trunk Assessment Upper Extremity Assessment Upper Extremity Assessment: Generalized weakness   Lower Extremity Assessment Lower Extremity Assessment: Generalized weakness   Cervical / Trunk Assessment  Cervical / Trunk Assessment: Other exceptions Cervical / Trunk Exceptions: Increased body habitus   Communication Communication Communication: Interpreter utilized;Prefers language other than English [place] [phone])   Cognition Arousal/Alertness: Awake/alert Behavior During Therapy: WFL for tasks assessed/performed Overall Cognitive Status:  Within Functional Limits for tasks assessed                                 General Comments: Pt slightly anxious and slightly fearful of moving. Becoming tearful. Able to redirect with calming conversation and talking about her kids   General Comments  SpO2 dropping to 84% on 15L HFNC + 10L NRB. HR 90s-110s. RR 20-30s.     Exercises Exercises: Other exercises Other Exercises Other Exercises: IS x5. Pulled Other Exercises: Flutter valve x5   Shoulder Instructions      Home Living Family/patient expects to be discharged to:: Private residence Living Arrangements: Spouse/significant other;Children;Other relatives Available Help at Discharge: Family Type of Home: Apartment (First floor) Home Access: Level entry     Home Layout: One level     Bathroom Shower/Tub: Chief Strategy Officer: Standard     Home Equipment: None   Additional Comments: Has five children at home. Ages 22-8      Prior Functioning/Environment Level of Independence: Independent        Comments: working on [employer] under[employer]        OT Problem List: Decreased strength;Decreased range of motion;Decreased activity tolerance;Impaired balance (sitting and/or standing);Decreased knowledge of use of DME or AE;Decreased knowledge of precautions;Cardiopulmonary status limiting activity;Obesity      OT Treatment/Interventions: Self-care/ADL training;Therapeutic exercise;Energy conservation;DME and/or AE instruction;Therapeutic activities;Patient/family education    OT Goals(Current goals can be found in the care plan section) Acute Rehab OT Goals Patient Stated Goal: to go home, feel better OT Goal Formulation: With patient Time For Goal Achievement: 10/26/20 Potential to Achieve Goals: Good  OT Frequency: Min 3X/week   Barriers to D/C:            Co-evaluation              AM-PAC OT "6 Clicks" Daily Activity     Outcome Measure Help from another person  eating meals?: A Little Help from another person taking care of personal grooming?: A Little Help from another person toileting, which includes using toliet, bedpan, or urinal?: A Lot Help from another person bathing (including washing, rinsing, drying)?: A Lot Help from another person to put on and taking off regular upper body clothing?: A Little Help from another person to put on and taking off regular lower body clothing?: A Lot 6 Click Score: 15   End of Session Equipment Utilized During Treatment: Oxygen Nurse Communication: Mobility status  Activity Tolerance: Patient limited by fatigue Patient left: in chair;with call bell/phone within reach  OT Visit Diagnosis: Unsteadiness on feet (R26.81);Other abnormalities of gait and mobility (R26.89);Muscle weakness (generalized) (M62.81)                Time: 3545-6256 OT Time Calculation (min): 37 min Charges:  OT General Charges $OT Visit: 1 Visit OT Evaluation $OT Eval Moderate Complexity: 1 Mod OT Treatments $Self Care/Home Management : 8-22 mins  Margarie Mcguirt MSOT, OTR/L Acute Rehab Pager: [phone] Office: [phone]  Theodoro Grist Umi Mainor 10/12/2020, 11:53 AM

## 2020-10-12 NOTE — Progress Notes (Signed)
FPTS Interim Progress Note  Went to see patient as respiration went up to 26.  When arrived in room patient resting comfortably in bed and nurse indicated patient O2 sat returned to 100 after falling to high 80's low 90's while up moving around. RR currently 20. Breathing and lung sounds improving from this morning. - Attempt to wean patient off of rebreather, will put on again if increased WOB or desaturations - Contact pathology tomorrow morning regarding Smudge Cells   Jovita Kussmaul, MD 10/12/2020, 6:18 PM PGY-1, [facility] Family Medicine Service pager [phone]

## 2020-10-12 NOTE — Progress Notes (Signed)
Family Medicine Teaching Service Daily Progress Note Intern Pager: [phone]  Patient name: Cheryl Kelley Medical record number: [id number] Date of birth: 1976-11-15 Age: 44 y.o. Gender: female  Primary Care Provider: Patient, No Pcp Per Consultants: None Code Status: Full  Pt Overview and Major Events to Date:  Admitted 11/6  Assessment and Plan: Cheryl Rosas-Floresis a 44 y.o.femalepresenting with dyspneaand cough. PMH is significant forobesity and gestational diabetes.   Acute Hypoxic Respiratory Failure 2/2 COVID-19 Currently on 15L non-rebreather and 10L nasal canula.  Currently satting at 100.  CRP D-dimer decreasing.  Patient indicates overall breathing is improved and only becomes short of breath when .  Indicates Tylenol helped with lower back pain.   -Vitals per routine, OOB with assistance only, regular diet -Airborne and contact precautions -Close monitoring of respiratory status -Wean supplemental O2 as tolerated, Goal SpO2 >90% -Continuous pulse ox -Remdesevir and Decadron daily(Day 3 of 5/10 ) -Trend D-dimer and CRP daily per protocol -Daily CBC andCMP -Tessalon 200mg  TID prn for cough - Start tylenol 1000 mg q6hr for back apin - PT/OT seeing patient  Hyperglycemia A1C- 7.9.  Blood sugars so far have reamined in 100's. -Monitoring blood sugars with morning BMPs -Continue sSSI given A1C and decadron treatment   FEN/GI:Regular diet Prophylaxis:Lovenox  Status is: Inpatient  Remains inpatient appropriate because:Inpatient level of care appropriate due to severity of illness   Dispo: The patient is from: Home              Anticipated d/c is to: Home              Anticipated d/c date is: 2 days              Patient currently is not medically stable to d/c.     Subjective:  Patient indicates she is feeling well and breathing improved.  Indicates that and her cough only   Objective: Temp:  [97.6 F (36.4 C)-99.2 F (37.3 C)] 97.6 F  (36.4 C) (11/08 1600) Pulse Rate:  [55-73] 67 (11/08 1600) Resp:  [19-27] 26 (11/08 1600) BP: (87-124)/(55-72) 110/56 (11/08 1600) SpO2:  [94 %-100 %] 100 % (11/08 1600)  Physical Exam: Physical Exam Constitutional:      General: She is not in acute distress.    Appearance: Normal appearance. She is ill-appearing.  HENT:     Head: Normocephalic and atraumatic.     Mouth/Throat:     Mouth: Mucous membranes are moist.  Cardiovascular:     Rate and Rhythm: Normal rate and regular rhythm.  Pulmonary:     Comments: Increased work of breathing with deep inspiration Abdominal:     General: Abdomen is flat. There is no distension.     Palpations: Abdomen is soft.  Neurological:     Mental Status: She is alert.     Laboratory: Recent Labs  Lab 10/10/20 1302 10/11/20 0305 10/12/20 0340  WBC 5.9 4.1 6.7  HGB 13.9 13.6 13.8  HCT 43.9 42.7 43.4  PLT 262 296 337   Recent Labs  Lab 10/10/20 1302 10/11/20 0305 10/12/20 0340  NA 139 139 139  K 3.9 4.1 4.2  CL 106 107 107  CO2 21* 24 23  BUN 6 8 15   CREATININE 0.43* 0.54 0.54  CALCIUM 8.2* 8.5* 8.5*  PROT 7.1 7.1 6.8  BILITOT 0.4 0.5 0.5  ALKPHOS 68 66 60  ALT 48* 47* 43  AST 79* 67* 53*  GLUCOSE 127* 143* 172*    CRP- 5.5 >>5.3>>1.9 D-dimer-  1.59 >>1.67>>1.44  Imaging/Diagnostic Tests: No new imaging  Jovita Kussmaul, MD 10/12/2020, 4:27 PM PGY-1, [facility] Family Medicine FPTS Intern pager: [phone], text pages welcome

## 2020-10-13 DIAGNOSIS — J1282 Pneumonia due to coronavirus disease 2019: Secondary | ICD-10-CM | POA: Diagnosis not present

## 2020-10-13 DIAGNOSIS — Z6841 Body Mass Index (BMI) 40.0 and over, adult: Secondary | ICD-10-CM | POA: Diagnosis not present

## 2020-10-13 DIAGNOSIS — U071 COVID-19: Secondary | ICD-10-CM | POA: Diagnosis not present

## 2020-10-13 DIAGNOSIS — J9601 Acute respiratory failure with hypoxia: Secondary | ICD-10-CM | POA: Diagnosis not present

## 2020-10-13 LAB — GLUCOSE, CAPILLARY
Glucose-Capillary: 123 mg/dL — ABNORMAL HIGH (ref 70–99)
Glucose-Capillary: 187 mg/dL — ABNORMAL HIGH (ref 70–99)
Glucose-Capillary: 271 mg/dL — ABNORMAL HIGH (ref 70–99)
Glucose-Capillary: 218 mg/dL — ABNORMAL HIGH (ref 70–99)

## 2020-10-13 LAB — COMPREHENSIVE METABOLIC PANEL
ALT: 38 U/L (ref 0–44)
AST: 37 U/L (ref 15–41)
Albumin: 2.6 g/dL — ABNORMAL LOW (ref 3.5–5.0)
Alkaline Phosphatase: 60 U/L (ref 38–126)
Anion gap: 10 (ref 5–15)
BUN: 16 mg/dL (ref 6–20)
CO2: 22 mmol/L (ref 22–32)
Calcium: 8.4 mg/dL — ABNORMAL LOW (ref 8.9–10.3)
Chloride: 105 mmol/L (ref 98–111)
Creatinine, Ser: 0.48 mg/dL (ref 0.44–1.00)
GFR, Estimated: >60 mL/min (ref >60)
Glucose, Bld: 228 mg/dL — ABNORMAL HIGH (ref 70–99)
Potassium: 4 mmol/L (ref 3.5–5.1)
Sodium: 137 mmol/L (ref 135–145)
Total Bilirubin: 0.4 mg/dL (ref 0.3–1.2)
Total Protein: 6.5 g/dL (ref 6.5–8.1)

## 2020-10-13 LAB — C-REACTIVE PROTEIN: CRP: 0.9 mg/dL (ref <1.0)

## 2020-10-13 LAB — D-DIMER, QUANTITATIVE: D-Dimer, Quant: 1.07 ug/mL-FEU — ABNORMAL HIGH (ref 0.00–0.50)

## 2020-10-13 MED ORDER — POLYETHYLENE GLYCOL 3350 17 G PO PACK
17.0000 g | PACK | Freq: Every day | ORAL | Status: DC | PRN
  Administered 2020-10-13: 17 g via ORAL
  Filled 2020-10-13: qty 1

## 2020-10-13 NOTE — Progress Notes (Signed)
°   10/13/20 0416  Assess: MEWS Score  Temp 98.3 F (36.8 C)  BP 104/65  Pulse Rate (!) 56  ECG Heart Rate (!) 57  Resp (!) 26  Level of Consciousness Alert  SpO2 90 %  O2 Device HFNC  Patient Activity (if Appropriate) In bed  O2 Flow Rate (L/min) 10 L/min  Assess: MEWS Score  MEWS Temp 0  MEWS Systolic 0  MEWS Pulse 0  MEWS RR 2  MEWS LOC 0  MEWS Score 2  MEWS Score Color Yellow  Assess: if the MEWS score is Yellow or Red  Were vital signs taken at a resting state? Yes  Focused Assessment No change from prior assessment  Early Detection of Sepsis Score *See Row Information* Low  MEWS guidelines implemented *See Row Information* Yes  Treat  MEWS Interventions Other (Comment) (Increased VS charting)  Pain Scale 0-10  Pain Score 0  Take Vital Signs  Increase Vital Sign Frequency  Yellow: Q 2hr X 2 then Q 4hr X 2, if remains yellow, continue Q 4hrs  Escalate  MEWS: Escalate Yellow: discuss with charge nurse/RN and consider discussing with provider and RRT  Notify: Charge Nurse/RN  Name of Charge Nurse/RN Notified Tobi Bastos, Consulting civil engineer  Date Charge Nurse/RN Notified 10/13/20  Time Charge Nurse/RN Notified 0420  Document  Patient Outcome Other (Comment) (Increase VS collection)  Progress note created (see row info) Yes

## 2020-10-13 NOTE — Progress Notes (Signed)
Physical Therapy Treatment Patient Details Name: Cheryl Kelley MRN: [id number] DOB: 1976/06/10 Today's Date: 10/13/2020    History of Present Illness Cheryl Kelley is a 44 y.o. female presenting with dyspnea and cough. PMH is significant for obesity and gestational diabetes.  Presents with acute hypoxic respiratory failure due to COVID-19.    PT Comments    Continuing work on functional mobility and activity tolerance;  Notable improvements in supplemented O2 requirements, as well as ability to stand and walk a few steps in her room, bed to recliner;  Seems less anxious today as well, although she did say that when she first stood up, she didn't think she could do it; Gave gentle encouragement and highlighted the improvements she has made; Dr. Pollie Meyer in for part of the session as well; Cheryl Kelley, Medical Interpreter (lost her interpreter ID#), facilitated communication via video interpretation;   I'm hopeful for more progressive ambulation over the next few sessions  Follow Up Recommendations  Home health PT     Equipment Recommendations  Rolling walker with 5" wheels;3in1 (PT) (bariatric)    Recommendations for Other Services       Precautions / Restrictions Precautions Precautions: Fall Precaution Comments: high O2 requirement    Mobility  Bed Mobility Overal bed mobility: Needs Assistance Bed Mobility: Supine to Sit     Supine to sit: Supervision     General bed mobility comments: Supervision for safety and to manage lines; Used bedrails; able to get up without physical assist  Transfers Overall transfer level: Needs assistance Equipment used: None Transfers: Sit to/from Stand Sit to Stand: Min guard (without phsyical contact)         General transfer comment: Powered up without physical assist; cues to self-monitor for activity tolerance  Ambulation/Gait Ambulation/Gait assistance: Min assist Gait Distance (Feet): 4 Feet Assistive device: 1 person  hand held assist       General Gait Details: Took a few steps bed to recliner nearer to the window; cues to self-monitor for activity tolernace; O2 sats decr to 78% on 8L HFNC   Stairs             Wheelchair Mobility    Modified Rankin (Stroke Patients Only)       Balance     Sitting balance-Leahy Scale: Good       Standing balance-Leahy Scale: Fair                              Cognition Arousal/Alertness: Awake/alert Behavior During Therapy: WFL for tasks assessed/performed Overall Cognitive Status: Within Functional Limits for tasks assessed                                 General Comments: Less anxious today      Exercises      General Comments General comments (skin integrity, edema, etc.): Os sats decr to 78% observed lowest during brief amb and standing activity; came back to mid to high 80s with seated rest; increased O2 to 12 L via HFNC because O2 sats did not recover to the 90s on 8L; Dr. Pollie Meyer aware      Pertinent Vitals/Pain Pain Assessment: No/denies pain    Home Living                      Prior Function  PT Goals (current goals can now be found in the care plan section) Acute Rehab PT Goals Patient Stated Goal: to go home, feel better PT Goal Formulation: With patient Time For Goal Achievement: 10/25/20 Potential to Achieve Goals: Good Progress towards PT goals: Progressing toward goals    Frequency    Min 3X/week      PT Plan Current plan remains appropriate    Co-evaluation              AM-PAC PT "6 Clicks" Mobility   Outcome Measure  Help needed turning from your back to your side while in a flat bed without using bedrails?: None Help needed moving from lying on your back to sitting on the side of a flat bed without using bedrails?: None Help needed moving to and from a bed to a chair (including a wheelchair)?: A Little Help needed standing up from a chair using  your arms (e.g., wheelchair or bedside chair)?: A Little Help needed to walk in hospital room?: A Little Help needed climbing 3-5 steps with a railing? : A Lot 6 Click Score: 19    End of Session Equipment Utilized During Treatment: Oxygen Activity Tolerance: Patient tolerated treatment well Patient left: in chair;with call bell/phone within reach;with nursing/sitter in room Nurse Communication: Mobility status PT Visit Diagnosis: Other abnormalities of gait and mobility (R26.89);Muscle weakness (generalized) (M62.81)     Time: 9563-8756 PT Time Calculation (min) (ACUTE ONLY): 44 min  Charges:  $Gait Training: 8-22 mins $Therapeutic Activity: 23-37 mins                     Van Clines, PT  Acute Rehabilitation Services Pager [phone] Office [phone]    Cheryl Kelley 10/13/2020, 5:21 PM

## 2020-10-13 NOTE — Plan of Care (Signed)
  Problem: Respiratory: Goal: Complications related to the disease process, condition or treatment will be avoided or minimized Outcome: Not Progressing

## 2020-10-13 NOTE — Progress Notes (Signed)
Family Medicine Teaching Service Daily Progress Note Intern Pager: [phone]  Patient name: Cheryl Kelley Medical record number: [id number] Date of birth: [birth year] Age: 44 y.o. Gender: female  Primary Care Provider: Patient, No Pcp Per Consultants: None Code Status: Full  Pt Overview and Major Events to Date:  Admitted 11/6  Assessment and Plan: Cheryl Rosas-Floresis a 44 y.o.femalepresenting with dyspneaand cough. PMH is significant forobesity and gestational diabetes.   Acute Hypoxic Respiratory Failure 2/2 COVID-19 Able to be weaned off non-rebreather and down to 8L HFNC.  Satting between 90-95 currently. CRP D-dimer decreasing.  ALT's now normal Patient indicates overall breathing is improved and only becomes short of breath when ambulating. Indicates Tylenol helped with lower back pain.  Overall continuing to improve.   - Vitals per routine, OOB with assistance only, regular diet -Airborne and contact precautions -Close monitoring of respiratory status -Wean supplemental O2 as tolerated, Goal SpO2 >90% -Continuous pulse ox -Remdesevir and Decadron daily(Day 4 of 5/10 ) -Trend D-dimer and CRP daily per protocol -Daily CBC andCMP -Tessalon 200mg  TID prn for cough - Tylenol 1000 mg q6hr for back apin - PT/OT seeing patient  Hyperglycemia A1C- 7.9. Blood sugars increased to 229 last night.  123 this morning. -Monitoring blood sugars with morning BMPs glucose checks with meals -Continue sSSI given A1C and decadron treatment  Smudge Cells Found incidentally on initial CBC w/ Diff.  Consulted with Pathology Dr , appreciate recs.  Patient denies history of any cancer in family.  Believes this finding to be artifact and not concerning for underlying pathology. - Nothing further  FEN/GI:Regular diet Prophylaxis:Lovenox   Status is: Inpatient  Remains inpatient appropriate because:Inpatient level of care appropriate due to severity of  illness   Dispo:  Patient From: Home  Planned Disposition: Home  Expected discharge date: 10/19/20  Medically stable for discharge: No       Subjective:  Patient indicates she feels well and still has appetite.  Only feels she has increased work of breathing when up walking around.  Continues to have productive cough with clear sputum, no blood.  Objective: Temp:  [97.6 F (36.4 C)-98.4 F (36.9 C)] 98.3 F (36.8 C) (11/09 0602) Pulse Rate:  [56-73] 70 (11/09 0602) Resp:  [24-27] 25 (11/09 0602) BP: (87-118)/(50-77) 95/57 (11/09 0602) SpO2:  [90 %-100 %] 94 % (11/09 0602) Physical Exam:  Physical Exam Constitutional:      Appearance: Normal appearance.  HENT:     Head: Normocephalic.     Mouth/Throat:     Mouth: Mucous membranes are moist.  Cardiovascular:     Rate and Rhythm: Normal rate and regular rhythm.  Pulmonary:     Breath sounds: Wheezing present.     Comments: Moderate wheezes bilaterally,  Abdominal:     General: Abdomen is flat. There is no distension.     Palpations: Abdomen is soft.  Skin:    General: Skin is warm.     Capillary Refill: Capillary refill takes less than 2 seconds.  Neurological:     Mental Status: She is alert.     Laboratory: Recent Labs  Lab 10/10/20 1302 10/11/20 0305 10/12/20 0340  WBC 5.9 4.1 6.7  HGB 13.9 13.6 13.8  HCT 43.9 42.7 43.4  PLT 262 296 337   Recent Labs  Lab 10/11/20 0305 10/12/20 0340 10/13/20 0036  NA 139 139 137  K 4.1 4.2 4.0  CL 107 107 105  CO2 24 23 22   BUN 8 15 16   CREATININE 0.54  0.54 0.48  CALCIUM 8.5* 8.5* 8.4*  PROT 7.1 6.8 6.5  BILITOT 0.5 0.5 0.4  ALKPHOS 66 60 60  ALT 47* 43 38  AST 67* 53* 37  GLUCOSE 143* 172* 228*    CRP- 5.5 >>5.3>>1.9>>0.9 D-dimer- 1.59 >>1.67>>1.44>>1.07  Imaging/Diagnostic Tests: No new Imaging  Jovita Kussmaul, MD 10/13/2020, 7:21 AM PGY-1, [facility] Family Medicine FPTS Intern pager: [phone], text pages welcome

## 2020-10-14 DIAGNOSIS — J1282 Pneumonia due to coronavirus disease 2019: Secondary | ICD-10-CM | POA: Diagnosis not present

## 2020-10-14 DIAGNOSIS — U071 COVID-19: Secondary | ICD-10-CM | POA: Diagnosis not present

## 2020-10-14 DIAGNOSIS — Z6841 Body Mass Index (BMI) 40.0 and over, adult: Secondary | ICD-10-CM | POA: Diagnosis not present

## 2020-10-14 DIAGNOSIS — J9601 Acute respiratory failure with hypoxia: Secondary | ICD-10-CM | POA: Diagnosis not present

## 2020-10-14 LAB — COMPREHENSIVE METABOLIC PANEL
ALT: 35 U/L (ref 0–44)
AST: 31 U/L (ref 15–41)
Albumin: 2.6 g/dL — ABNORMAL LOW (ref 3.5–5.0)
Alkaline Phosphatase: 68 U/L (ref 38–126)
Anion gap: 9 (ref 5–15)
BUN: 14 mg/dL (ref 6–20)
CO2: 22 mmol/L (ref 22–32)
Calcium: 8.3 mg/dL — ABNORMAL LOW (ref 8.9–10.3)
Chloride: 105 mmol/L (ref 98–111)
Creatinine, Ser: 0.43 mg/dL — ABNORMAL LOW (ref 0.44–1.00)
GFR, Estimated: >60 mL/min (ref >60)
Glucose, Bld: 161 mg/dL — ABNORMAL HIGH (ref 70–99)
Potassium: 4.2 mmol/L (ref 3.5–5.1)
Sodium: 136 mmol/L (ref 135–145)
Total Bilirubin: 0.6 mg/dL (ref 0.3–1.2)
Total Protein: 6.8 g/dL (ref 6.5–8.1)

## 2020-10-14 LAB — GLUCOSE, CAPILLARY
Glucose-Capillary: 124 mg/dL — ABNORMAL HIGH (ref 70–99)
Glucose-Capillary: 143 mg/dL — ABNORMAL HIGH (ref 70–99)
Glucose-Capillary: 132 mg/dL — ABNORMAL HIGH (ref 70–99)
Glucose-Capillary: 167 mg/dL — ABNORMAL HIGH (ref 70–99)

## 2020-10-14 LAB — C-REACTIVE PROTEIN: CRP: 0.8 mg/dL (ref <1.0)

## 2020-10-14 LAB — D-DIMER, QUANTITATIVE: D-Dimer, Quant: 0.85 ug/mL-FEU — ABNORMAL HIGH (ref 0.00–0.50)

## 2020-10-14 MED ORDER — GUAIFENESIN-DM 100-10 MG/5ML PO SYRP
5.0000 mL | ORAL_SOLUTION | ORAL | Status: DC | PRN
  Administered 2020-10-14 – 2020-10-16 (×2): 5 mL via ORAL
  Filled 2020-10-14 (×2): qty 5

## 2020-10-14 MED ORDER — LINAGLIPTIN 5 MG PO TABS
5.0000 mg | ORAL_TABLET | Freq: Every day | ORAL | Status: DC
  Administered 2020-10-14 – 2020-10-18 (×5): 5 mg via ORAL
  Filled 2020-10-14 (×5): qty 1

## 2020-10-14 NOTE — Progress Notes (Addendum)
Family Medicine Teaching Service Daily Progress Note Intern Pager: [phone]  Patient name: Cheryl Kelley Medical record number: [id number] Date of birth: Dec 03, 1976 Age: 44 y.o. Gender: female  Primary Care Provider: Patient, No Pcp Per Consultants: None Code Status: Full  Pt Overview and Major Events to Date:  Admitted 11/6  Assessment and Plan: Cheryl Rosas-Floresis a 43 y.o.femalepresenting with dyspneaand cough. PMH is significant forobesity and gestational diabetes.  Acute Hypoxic Respiratory Failure 2/2 COVID-19 Satting between 90-95 currently. CRP D-dimerdecreasing.  ALT's now normal Patient indicates overallbreathing is improved and only becomes short of breath whenambulating.  Desatted yesterday when ambulating and O2 increased to 12L.Indicates Tylenol helped withlower back pain.  Overall continuing to improve.   -Vitals per routine, OOB with assistance only, regular diet -Airborne and contact precautions -Close monitoring of respiratory status -Wean supplemental O2 astolerated, Goal SpO2 >90% -Continuous pulse ox -Remdesevir and Decadron daily(Day 4 of 5/10) -Trend D-dimer and CRP daily per protocol -Daily CBC andCMP -Tessalon 200mg  TID prn for cough - Tylenol 1000 mg q6hr for back apin - PT/OT seeing patient -Will be COVID cleared on 11/16   Hyperglycemia A1C- 7.9. Blood sugars increased to 229 last night.  123 this morning. -Monitoring blood sugars with morning BMPs glucose checks with meals -ContinuesSSI given A1C and decadron treatment --Start Linagliptin 5 mg  FEN/GI:Regular diet Prophylaxis:Lovenox   Status is: Inpatient  Remains inpatient appropriate because:Inpatient level of care appropriate due to severity of illness   Dispo:  Patient From: Home  Planned Disposition: Home  Expected discharge date: 10/19/20  Medically stable for discharge: No      Subjective:  Patient indicates she feels well and still has  appetite.  Only feels she has increased work of breathing when up walking around.  Continues to have productive cough with clear sputum, no blood.  Spoke with daughter and other family on phone while with patient and answered questions about treatments and informed she appears to be continuing to improve.  Objective: Temp:  [98.1 F (36.7 C)-99.1 F (37.3 C)] 98.1 F (36.7 C) (11/10 0721) Pulse Rate:  [55-81] 75 (11/10 0721) Resp:  [20-27] 25 (11/10 0721) BP: (108-126)/(51-76) 108/71 (11/10 0721) SpO2:  [90 %-93 %] 91 % (11/10 0721) Physical Exam:  Physical Exam Constitutional:      General: She is not in acute distress.    Appearance: Normal appearance. She is not ill-appearing.  HENT:     Head: Normocephalic and atraumatic.     Nose: No congestion.     Mouth/Throat:     Mouth: Mucous membranes are moist.  Cardiovascular:     Rate and Rhythm: Normal rate and regular rhythm.  Pulmonary:     Effort: Pulmonary effort is normal.     Breath sounds: Normal breath sounds.  Skin:    General: Skin is warm.  Neurological:     Mental Status: She is alert.     Laboratory: Recent Labs  Lab 10/10/20 1302 10/11/20 0305 10/12/20 0340  WBC 5.9 4.1 6.7  HGB 13.9 13.6 13.8  HCT 43.9 42.7 43.4  PLT 262 296 337   Recent Labs  Lab 10/12/20 0340 10/13/20 0036 10/14/20 0019  NA 139 137 136  K 4.2 4.0 4.2  CL 107 105 105  CO2 23 22 22   BUN 15 16 14   CREATININE 0.54 0.48 0.43*  CALCIUM 8.5* 8.4* 8.3*  PROT 6.8 6.5 6.8  BILITOT 0.5 0.4 0.6  ALKPHOS 60 60 68  ALT 43 38 35  AST  53* 37 31  GLUCOSE 172* 228* 161*   CRP- 5.5 >>5.3>>1.9>>0.8 D-dimer- 1.59 >>1.67>>1.44>>0.85  Imaging/Diagnostic Tests: No new imaging  Cheryl Kussmaul, MD 10/14/2020, 8:38 AM PGY-1, [facility] FPTS Intern pager: [phone], text pages welcome

## 2020-10-14 NOTE — Plan of Care (Signed)
°  Problem: Education: Goal: Knowledge of General Education information will improve Description: Including pain rating scale, medication(s)/side effects and non-pharmacologic comfort measures Outcome: Progressing   Problem: Health Behavior/Discharge Planning: Goal: Ability to manage health-related needs will improve Outcome: Progressing   Problem: Clinical Measurements: Goal: Ability to maintain clinical measurements within normal limits will improve Outcome: Progressing Goal: Will remain free from infection Outcome: Progressing Goal: Diagnostic test results will improve Outcome: Progressing Goal: Cardiovascular complication will be avoided Outcome: Progressing   Problem: Activity: Goal: Risk for activity intolerance will decrease Outcome: Progressing   Problem: Nutrition: Goal: Adequate nutrition will be maintained Outcome: Progressing   Problem: Coping: Goal: Level of anxiety will decrease Outcome: Progressing   Problem: Elimination: Goal: Will not experience complications related to bowel motility Outcome: Progressing Goal: Will not experience complications related to urinary retention Outcome: Progressing   Problem: Pain Managment: Goal: General experience of comfort will improve Outcome: Progressing   Problem: Safety: Goal: Ability to remain free from injury will improve Outcome: Progressing   Problem: Skin Integrity: Goal: Risk for impaired skin integrity will decrease Outcome: Progressing   Problem: Education: Goal: Knowledge of risk factors and measures for prevention of condition will improve Outcome: Progressing   Problem: Coping: Goal: Psychosocial and spiritual needs will be supported Outcome: Progressing   Problem: Respiratory: Goal: Will maintain a patent airway Outcome: Progressing   Problem: Clinical Measurements: Goal: Respiratory complications will improve Outcome: Not Progressing   Problem: Respiratory: Goal: Complications related  to the disease process, condition or treatment will be avoided or minimized Outcome: Not Progressing

## 2020-10-15 DIAGNOSIS — J9601 Acute respiratory failure with hypoxia: Secondary | ICD-10-CM | POA: Diagnosis not present

## 2020-10-15 DIAGNOSIS — J1282 Pneumonia due to coronavirus disease 2019: Secondary | ICD-10-CM | POA: Diagnosis not present

## 2020-10-15 DIAGNOSIS — Z6841 Body Mass Index (BMI) 40.0 and over, adult: Secondary | ICD-10-CM | POA: Diagnosis not present

## 2020-10-15 DIAGNOSIS — U071 COVID-19: Secondary | ICD-10-CM | POA: Diagnosis not present

## 2020-10-15 LAB — COMPREHENSIVE METABOLIC PANEL
ALT: 45 U/L — ABNORMAL HIGH (ref 0–44)
AST: 62 U/L — ABNORMAL HIGH (ref 15–41)
Albumin: 2.5 g/dL — ABNORMAL LOW (ref 3.5–5.0)
Alkaline Phosphatase: 65 U/L (ref 38–126)
Anion gap: 7 (ref 5–15)
BUN: 17 mg/dL (ref 6–20)
CO2: 25 mmol/L (ref 22–32)
Calcium: 8.3 mg/dL — ABNORMAL LOW (ref 8.9–10.3)
Chloride: 104 mmol/L (ref 98–111)
Creatinine, Ser: 0.59 mg/dL (ref 0.44–1.00)
GFR, Estimated: >60 mL/min (ref >60)
Glucose, Bld: 125 mg/dL — ABNORMAL HIGH (ref 70–99)
Potassium: 4 mmol/L (ref 3.5–5.1)
Sodium: 136 mmol/L (ref 135–145)
Total Bilirubin: 0.8 mg/dL (ref 0.3–1.2)
Total Protein: 6.5 g/dL (ref 6.5–8.1)

## 2020-10-15 LAB — CULTURE, BLOOD (ROUTINE X 2)
Culture: NO GROWTH
Culture: NO GROWTH
Special Requests: ADEQUATE

## 2020-10-15 LAB — GLUCOSE, CAPILLARY
Glucose-Capillary: 120 mg/dL — ABNORMAL HIGH (ref 70–99)
Glucose-Capillary: 180 mg/dL — ABNORMAL HIGH (ref 70–99)
Glucose-Capillary: 193 mg/dL — ABNORMAL HIGH (ref 70–99)
Glucose-Capillary: 196 mg/dL — ABNORMAL HIGH (ref 70–99)

## 2020-10-15 LAB — D-DIMER, QUANTITATIVE: D-Dimer, Quant: 0.84 ug/mL-FEU — ABNORMAL HIGH (ref 0.00–0.50)

## 2020-10-15 LAB — C-REACTIVE PROTEIN: CRP: 0.9 mg/dL (ref <1.0)

## 2020-10-15 NOTE — Progress Notes (Signed)
Family Medicine Teaching Service Daily Progress Note Intern Pager: [phone]  Patient name: Cheryl Kelley Medical record number: [id number] Date of birth: 1976/10/14 Age: 44 y.o. Gender: female  Primary Care Provider: Patient, No Pcp Per Consultants: None Code Status: Full  Pt Overview and Major Events to Date:  Admitted 11/6  Assessment and Plan: Analiz Rosas-Floresis a 44 y.o.femalepresenting with dyspneaand cough. PMH is significant forobesity and gestational diabetes.  Acute Hypoxic Respiratory Failure 2/2 COVID-19 Satting between 90-95 currently.CRP D-dimerdecreasing.ALT's now normalPatient indicates overallbreathing is improved and only becomes short of breath whenambulating.  Desatted yesterday when ambulating and O2 decreased to 10L.Indicates Tylenol helped withlower back pain.Overall continuing to improve.  -Vitals per routine, OOB with assistance only, regular diet -Airborne and contact precautions -Close monitoring of respiratory status -Wean supplemental O2 astolerated, Goal SpO2 >90% -Continuous pulse ox -Remdesevir and Decadron daily(Day4of 5/10) -Will stop trending D-Dimer and CRP -Daily CBC andCMP -Tessalon 200mg  TID prn for cough -Tylenol 1000 mg q6hr for back apin - PT/OT seeing patient -Will be COVID cleared on 11/16  Hyperglycemia A1C- 7.9. Blood sugarsincreased to 229 last night. 123 this morning.  Will consider stopping Linagliptin -Monitoring blood sugars with morning BMPsglucose checks with meals -ContinuesSSI given A1C and decadron treatment --Continue Linagliptin 5 mg - Monitor diziness may be dropping to hypoglycemic at night, if repeat episodes can stop Linagliptin  FEN/GI:Regular diet Prophylaxis:Lovenox   Status is: Inpatient  Remains inpatient appropriate because:Inpatient level of care appropriate due to severity of illness   Dispo:  Patient From: Home  Planned Disposition: Home  Expected discharge  date: 10/19/20  Medically stable for discharge: No    Subjective:  Patient indicates feels her breathing is still improving.  Still has difficulty breathing with ambulation.  Also reports some dizziness when laying in bed overnight.  No pain or other concerns currently.  Objective: Temp:  [97.9 F (36.6 C)-99.1 F (37.3 C)] 99.1 F (37.3 C) (11/11 0720) Pulse Rate:  [68-86] 83 (11/11 0720) Resp:  [18-23] 20 (11/11 0720) BP: (93-117)/(65-73) 113/69 (11/11 0720) SpO2:  [93 %-97 %] 97 % (11/11 0720) Physical Exam:  Physical Exam Constitutional:      Appearance: Normal appearance.  HENT:     Mouth/Throat:     Mouth: Mucous membranes are moist.  Cardiovascular:     Rate and Rhythm: Normal rate and regular rhythm.  Pulmonary:     Breath sounds: Normal breath sounds.     Comments: Increased work of breathing with deep inspiration Abdominal:     General: Abdomen is flat. There is no distension.     Palpations: Abdomen is soft.     Tenderness: There is no abdominal tenderness.  Neurological:     General: No focal deficit present.     Mental Status: She is alert.  Psychiatric:        Mood and Affect: Mood normal.        Behavior: Behavior normal.      Laboratory: Recent Labs  Lab 10/10/20 1302 10/11/20 0305 10/12/20 0340  WBC 5.9 4.1 6.7  HGB 13.9 13.6 13.8  HCT 43.9 42.7 43.4  PLT 262 296 337   Recent Labs  Lab 10/13/20 0036 10/14/20 0019 10/15/20 0126  NA 137 136 136  K 4.0 4.2 4.0  CL 105 105 104  CO2 22 22 25   BUN 16 14 17   CREATININE 0.48 0.43* 0.59  CALCIUM 8.4* 8.3* 8.3*  PROT 6.5 6.8 6.5  BILITOT 0.4 0.6 0.8  ALKPHOS 60 68 65  ALT 38 35 45*  AST 37 31 62*  GLUCOSE 228* 161* 125*   CRP- 5.5 >>5.3>>1.9>>0.9>>0.9 D-dimer- 1.59 >>1.67>>1.44>>1.07>>0.84   Imaging/Diagnostic Tests: No new imaging  Jovita Kussmaul, MD 10/15/2020, 7:25 AM PGY-1, [facility] Family [facility] FPTS Intern pager: [phone], text pages welcome

## 2020-10-15 NOTE — Progress Notes (Signed)
Physical Therapy Treatment Patient Details Name: Cheryl Kelley MRN: [id number] DOB: 1976-02-07 Today's Date: 10/15/2020    History of Present Illness Reatha Rosas-Flores is a 44 y.o. female presenting with dyspnea and cough. PMH is significant for obesity and gestational diabetes.  Presents with acute hypoxic respiratory failure due to COVID-19.    PT Comments    Patient reports feeling better today (via interpreter). At rest on 6L O2 with sats 93%, HR 80. Ambulated on 6L with lowest sats 87%. Reviewed use of IS and flutter valve with good return demonstration. Educated on sat levels and her improving tolerance for activity.      Follow Up Recommendations  Home health PT     Equipment Recommendations  None recommended by PT    Recommendations for Other Services       Precautions / Restrictions Precautions Precautions: Fall    Mobility  Bed Mobility                  Transfers Overall transfer level: Needs assistance Equipment used: None Transfers: Sit to/from Stand Sit to Stand: Supervision         General transfer comment: for safety due to multiple lines; stood x 3 reps during session  Ambulation/Gait Ambulation/Gait assistance: Min guard Gait Distance (Feet): 100 Feet (2 laps in room; rest; 20 x 2 (to/from bathroom)) Assistive device: None Gait Pattern/deviations: Step-through pattern;Decreased stride length;Wide base of support   Gait velocity interpretation: <1.31 ft/sec, indicative of household ambulator General Gait Details: occasional use of UE support on secure surfaces (footboard, counter) however with no LOB. Uses very slow velocity to maintain sats.    Stairs             Wheelchair Mobility    Modified Rankin (Stroke Patients Only)       Balance Overall balance assessment: Needs assistance Sitting-balance support: Feet supported;No upper extremity supported Sitting balance-Leahy Scale: Good     Standing balance support:  During functional activity;Single extremity supported Standing balance-Leahy Scale: Fair                              Cognition Arousal/Alertness: Awake/alert Behavior During Therapy: WFL for tasks assessed/performed Overall Cognitive Status: Within Functional Limits for tasks assessed                                        Exercises Other Exercises Other Exercises: IS x 5, pt pulling 750 with + cough Other Exercises: Flutter valve x 1 + cough    General Comments General comments (skin integrity, edema, etc.): Virtual interpreter used: Salayda      Pertinent Vitals/Pain Pain Assessment: No/denies pain    Home Living                      Prior Function            PT Goals (current goals can now be found in the care plan section) Acute Rehab PT Goals Patient Stated Goal: to go home, feel better Time For Goal Achievement: 10/25/20 Potential to Achieve Goals: Good Progress towards PT goals: Progressing toward goals    Frequency    Min 3X/week      PT Plan Current plan remains appropriate    Co-evaluation              AM-PAC PT "  6 Clicks" Mobility   Outcome Measure  Help needed turning from your back to your side while in a flat bed without using bedrails?: None Help needed moving from lying on your back to sitting on the side of a flat bed without using bedrails?: None Help needed moving to and from a bed to a chair (including a wheelchair)?: None Help needed standing up from a chair using your arms (e.g., wheelchair or bedside chair)?: None Help needed to walk in hospital room?: A Little Help needed climbing 3-5 steps with a railing? : A Little 6 Click Score: 22    End of Session Equipment Utilized During Treatment: Oxygen Activity Tolerance: Patient tolerated treatment well Patient left: in chair;with call bell/phone within reach   PT Visit Diagnosis: Other abnormalities of gait and mobility (R26.89);Muscle  weakness (generalized) (M62.81)     Time: 9323-5573 PT Time Calculation (min) (ACUTE ONLY): 41 min  Charges:  $Gait Training: 23-37 mins $Therapeutic Exercise: 8-22 mins                      Jerolyn Center, PT Pager [phone]    Zena Amos 10/15/2020, 4:49 PM

## 2020-10-15 NOTE — Progress Notes (Addendum)
   10/14/20 2031  Assess: MEWS Score  Temp 98 F (36.7 C)  BP 94/66  Pulse Rate 82  ECG Heart Rate 83  Resp (!) 22  Level of Consciousness Alert  SpO2 96 %  O2 Device HFNC  Patient Activity (if Appropriate) In bed  O2 Flow Rate (L/min) 12 L/min  Assess: MEWS Score  MEWS Temp 0  MEWS Systolic 1  MEWS Pulse 0  MEWS RR 1  MEWS LOC 0  MEWS Score 2  MEWS Score Color Yellow  Assess: if the MEWS score is Yellow or Red  Were vital signs taken at a resting state? Yes  Focused Assessment No change from prior assessment  Early Detection of Sepsis Score *See Row Information* Low  MEWS guidelines implemented *See Row Information* Yes  Treat  MEWS Interventions Other (Comment) (Increase VS charting)  Pain Scale 0-10  Pain Score 0  Take Vital Signs  Increase Vital Sign Frequency  Yellow: Q 2hr X 2 then Q 4hr X 2, if remains yellow, continue Q 4hrs  Escalate  MEWS: Escalate Yellow: discuss with charge nurse/RN and consider discussing with provider and RRT  Notify: Charge Nurse/RN  Name of Charge Nurse/RN Notified Tobi Bastos, Consulting civil engineer  Date Charge Nurse/RN Notified 10/14/20  Time Charge Nurse/RN Notified 2032  Document  Patient Outcome Other (Comment) (Increase VS charting)  Progress note created (see row info) Yes

## 2020-10-15 NOTE — Progress Notes (Signed)
OT Cancellation Note  Patient Details Name: Shaquana Buel MRN: [id number] DOB: 1976/01/01   Cancelled Treatment:    Reason Eval/Treat Not Completed: Other (comment) (Just finished with PT) Will return as schedule allows.  Jahliyah Trice M Natosha Bou Dyllin Gulley MSOT, OTR/L Acute Rehab Pager: [phone] Office: [phone] 10/15/2020, 2:38 PM

## 2020-10-16 ENCOUNTER — Other Ambulatory Visit (HOSPITAL_COMMUNITY): Payer: Self-pay | Admitting: Family Medicine

## 2020-10-16 DIAGNOSIS — Z6841 Body Mass Index (BMI) 40.0 and over, adult: Secondary | ICD-10-CM | POA: Diagnosis not present

## 2020-10-16 DIAGNOSIS — U071 COVID-19: Secondary | ICD-10-CM | POA: Diagnosis not present

## 2020-10-16 DIAGNOSIS — J9601 Acute respiratory failure with hypoxia: Secondary | ICD-10-CM | POA: Diagnosis not present

## 2020-10-16 DIAGNOSIS — J1282 Pneumonia due to coronavirus disease 2019: Secondary | ICD-10-CM | POA: Diagnosis not present

## 2020-10-16 LAB — COMPREHENSIVE METABOLIC PANEL
ALT: 43 U/L (ref 0–44)
AST: 41 U/L (ref 15–41)
Albumin: 2.7 g/dL — ABNORMAL LOW (ref 3.5–5.0)
Alkaline Phosphatase: 73 U/L (ref 38–126)
Anion gap: 10 (ref 5–15)
BUN: 12 mg/dL (ref 6–20)
CO2: 22 mmol/L (ref 22–32)
Calcium: 8.7 mg/dL — ABNORMAL LOW (ref 8.9–10.3)
Chloride: 102 mmol/L (ref 98–111)
Creatinine, Ser: 0.45 mg/dL (ref 0.44–1.00)
GFR, Estimated: >60 mL/min (ref >60)
Glucose, Bld: 160 mg/dL — ABNORMAL HIGH (ref 70–99)
Potassium: 4.1 mmol/L (ref 3.5–5.1)
Sodium: 134 mmol/L — ABNORMAL LOW (ref 135–145)
Total Bilirubin: 0.7 mg/dL (ref 0.3–1.2)
Total Protein: 6.7 g/dL (ref 6.5–8.1)

## 2020-10-16 LAB — CBC
HCT: 41.3 % (ref 36.0–46.0)
Hemoglobin: 13.4 g/dL (ref 12.0–15.0)
MCH: 27.5 pg (ref 26.0–34.0)
MCHC: 32.4 g/dL (ref 30.0–36.0)
MCV: 84.6 fL (ref 80.0–100.0)
Platelets: 317 10*3/uL (ref 150–400)
RBC: 4.88 MIL/uL (ref 3.87–5.11)
RDW: 13.3 % (ref 11.5–15.5)
WBC: 8.5 10*3/uL (ref 4.0–10.5)
nRBC: 0 % (ref 0.0–0.2)

## 2020-10-16 LAB — GLUCOSE, CAPILLARY
Glucose-Capillary: 137 mg/dL — ABNORMAL HIGH (ref 70–99)
Glucose-Capillary: 120 mg/dL — ABNORMAL HIGH (ref 70–99)
Glucose-Capillary: 224 mg/dL — ABNORMAL HIGH (ref 70–99)
Glucose-Capillary: 246 mg/dL — ABNORMAL HIGH (ref 70–99)

## 2020-10-16 MED ORDER — METFORMIN HCL ER 500 MG PO TB24: 500.0000 mg | ORAL_TABLET | Freq: Every day | ORAL | 11 refills | Status: AC

## 2020-10-16 MED ORDER — DEXAMETHASONE 6 MG PO TABS: 6.0000 mg | ORAL_TABLET | Freq: Every day | ORAL | 0 refills | Status: AC

## 2020-10-16 MED FILL — METFORMIN HCL ER 500 MG TB2: 500 | 30 days supply | Qty: 30 | Fill #0

## 2020-10-16 MED FILL — DEXAMETHASONE 6 MG TABLET: 6 | 2 days supply | Qty: 2 | Fill #0

## 2020-10-16 NOTE — Progress Notes (Signed)
Family Medicine Teaching Service Daily Progress Note Intern Pager: [phone]  Patient name: Cheryl Kelley Medical record number: [id number] Date of birth: 11-14-1976 Age: 44 y.o. Gender: female  Primary Care Provider: Patient, No Pcp Per Consultants: None Code Status: Full  Pt Overview and Major Events to Date:  Admitted 11/6  Assessment and Plan: Cheryl Rosas-Floresis a 44 y.o.femalepresenting with dyspneaand cough. PMH is significant forobesity and gestational diabetes.   Acute Hypoxic Respiratory Failure 2/2 COVID-19 Satting between 90-95 currently.ALT's now normalPatient indicates overallbreathing is improved and only becomes short of breath whenambulating. Desatted yesterday when ambulating and O2 decreased to 4L.  Overall continuing to improve.  -Vitals per routine, OOB with assistance only, regular diet -Airborne and contact precautions -Close monitoring of respiratory status -Wean supplemental O2 astolerated, Goal SpO2 >90% -Continuous pulse ox -Decadron day 6 of 10 -DailyCMP -Tessalon 200mg  TID prn for cough -Tylenol 1000 mg q6hr for back apin - PT/OT seeing patient -Will be COVID cleared on11/16  Hyperglycemia A1C- 7.9. Blood sugarsincreased to 196 last night. 137 this morning.  No further episodes of diziness. -Monitoring blood sugars with morning BMPsglucose checks with meals -ContinuesSSI given A1C and decadron treatment --Continue Linagliptin 5 mg - Plan on starting metformin at D/C    FEN/GI:Regular diet Prophylaxis:Lovenox  Status is: Inpatient  Remains inpatient appropriate because:Inpatient level of care appropriate due to severity of illness   Dispo:  Patient From: Home  Planned Disposition: Home  Expected discharge date: 10/19/20  Medically stable for discharge: No    Subjective:  Indicates Cheryl Kelley feels like her breathing has improved with ambulation as well.  Not having any more symptoms of dizizness.     Objective: Temp:  [98 F (36.7 C)] 98 F (36.7 C) (11/12 0403) Pulse Rate:  [68-87] 68 (11/12 0403) Resp:  [20-25] 21 (11/12 0403) BP: (107-123)/(53-73) 123/71 (11/12 0403) SpO2:  [90 %-96 %] 90 % (11/12 0403) Physical Exam:  Physical Exam Constitutional:      Appearance: Normal appearance.  HENT:     Head: Normocephalic and atraumatic.     Mouth/Throat:     Mouth: Mucous membranes are moist.  Cardiovascular:     Rate and Rhythm: Normal rate and regular rhythm.  Pulmonary:     Effort: Pulmonary effort is normal.     Breath sounds: Normal breath sounds.     Comments: Increased WOB with deep inspirations Abdominal:     General: Abdomen is flat.     Palpations: Abdomen is soft.  Skin:    General: Skin is warm.     Capillary Refill: Capillary refill takes less than 2 seconds.  Neurological:     Mental Status: Cheryl Kelley is alert.     Laboratory: Recent Labs  Lab 10/11/20 0305 10/12/20 0340 10/16/20 0620  WBC 4.1 6.7 8.5  HGB 13.6 13.8 13.4  HCT 42.7 43.4 41.3  PLT 296 337 317   Recent Labs  Lab 10/14/20 0019 10/15/20 0126 10/16/20 0019  NA 136 136 134*  K 4.2 4.0 4.1  CL 105 104 102  CO2 22 25 22   BUN 14 17 12   CREATININE 0.43* 0.59 0.45  CALCIUM 8.3* 8.3* 8.7*  PROT 6.8 6.5 6.7  BILITOT 0.6 0.8 0.7  ALKPHOS 68 65 73  ALT 35 45* 43  AST 31 62* 41  GLUCOSE 161* 125* 160*     Imaging/Diagnostic Tests: No new imaging  13/12/21, MD 10/16/2020, 8:04 AM PGY-1, [facility] Family Medicine FPTS Intern pager: [phone], text pages welcome

## 2020-10-16 NOTE — Progress Notes (Signed)
Occupational Therapy Treatment Patient Details Name: Cheryl Kelley MRN: [id number] DOB: 26-Mar-1976 Today's Date: 10/16/2020    History of present illness Cheryl Kelley is a 44 y.o. female presenting with dyspnea and cough. PMH is significant for obesity and gestational diabetes.  Presents with acute hypoxic respiratory failure due to COVID-19.   OT comments  Pt seen for OT follow up session with focus on OOB ADL progression. Pt completed sit <> stands with supervision from recliner to mobilize to bathroom for toilet transfer. She was able to complete this at supervision level. She then completed anterior peri care with set up assist and posterior peri care with max A for thorough task completion. She then stood at the sink for x2 grooming tasks with O2 sats stable. Pt on 4L [place] throughout session with VSS. D/c recs remain appropriate, although pt may progress without. Will continue to follow.   Follow Up Recommendations  Home health OT;Supervision - Intermittent (may progress without)    Equipment Recommendations  3 in 1 bedside commode (bariatric)    Recommendations for Other Services      Precautions / Restrictions Precautions Precautions: Fall Restrictions Weight Bearing Restrictions: No       Mobility Bed Mobility Overal bed mobility: Needs Assistance Bed Mobility: Supine to Sit     Supine to sit: Supervision     General bed mobility comments: up in chair, returned to chair  Transfers Overall transfer level: Needs assistance Equipment used: None Transfers: Sit to/from Stand Sit to Stand: Supervision         General transfer comment: for safety due to multiple lines; stood x 3 reps during session    Balance Overall balance assessment: Mild deficits observed, not formally tested Sitting-balance support: Feet supported;No upper extremity supported Sitting balance-Leahy Scale: Good     Standing balance support: No upper extremity supported Standing  balance-Leahy Scale: Good Standing balance comment: ambulated without AD                           ADL either performed or assessed with clinical judgement   ADL Overall ADL's : Needs assistance/impaired     Grooming: Set up;Oral care;Brushing hair;Standing Grooming Details (indicate cue type and reason): standing at sink for x2 grooming tasks     Lower Body Bathing: Moderate assistance;Sit to/from stand           Toilet Transfer: Min guard;Ambulation;Regular Toilet;Grab bars   Toileting- Clothing Manipulation and Hygiene: Maximal assistance;Sit to/from stand Toileting - Clothing Manipulation Details (indicate cue type and reason): pt able to clean anterior area in standing, but required max A to complete posterior area thoroughly     Functional mobility during ADLs: Min guard General ADL Comments: pt able to progress to standing ADL routine     Vision Patient Visual Report: No change from baseline     Perception     Praxis      Cognition Arousal/Alertness: Awake/alert Behavior During Therapy: WFL for tasks assessed/performed Overall Cognitive Status: Within Functional Limits for tasks assessed                                 General Comments: use of video interpreter        Exercises Other Exercises Other Exercises: IS x 5, pt pulling 750 with + cough Other Exercises: Flutter valve x 5 + cough   Shoulder Instructions  General Comments Educated pt on correct use of incentive spirometer.  Also, discussed mobility has improved enough that she could do bed/chair transfers independently but still recommend assist for walking    Pertinent Vitals/ Pain       Pain Assessment: No/denies pain  Home Living                                          Prior Functioning/Environment              Frequency  Min 3X/week        Progress Toward Goals  OT Goals(current goals can now be found in the care plan  section)  Progress towards OT goals: Progressing toward goals  Acute Rehab OT Goals Patient Stated Goal: to go home, feel better OT Goal Formulation: With patient Time For Goal Achievement: 10/26/20 Potential to Achieve Goals: Good  Plan Discharge plan remains appropriate    Co-evaluation                 AM-PAC OT "6 Clicks" Daily Activity     Outcome Measure   Help from another person eating meals?: A Little Help from another person taking care of personal grooming?: A Little Help from another person toileting, which includes using toliet, bedpan, or urinal?: A Lot Help from another person bathing (including washing, rinsing, drying)?: A Little Help from another person to put on and taking off regular upper body clothing?: A Little Help from another person to put on and taking off regular lower body clothing?: A Lot 6 Click Score: 16    End of Session Equipment Utilized During Treatment: Oxygen  OT Visit Diagnosis: Unsteadiness on feet (R26.81);Other abnormalities of gait and mobility (R26.89);Muscle weakness (generalized) (M62.81)   Activity Tolerance Patient tolerated treatment well   Patient Left in chair;with call bell/phone within reach   Nurse Communication Mobility status        Time: 3299-2426 OT Time Calculation (min): 30 min  Charges: OT General Charges $OT Visit: 1 Visit OT Treatments $Self Care/Home Management : 23-37 mins  Dalphine Handing, MSOT, OTR/L Acute Rehabilitation Services [facility] Office Number: [phone] Pager: [phone]  Dalphine Handing 10/16/2020, 2:19 PM

## 2020-10-16 NOTE — TOC Initial Note (Addendum)
Transition of Care [facility]) - Initial/Assessment Note    Patient Details  Name: Cheryl Kelley MRN: [id number] Date of Birth: 1976-08-23  Transition [facility] Care [facility]) CM/SW Contact:    Lockie Pares, RN Phone Number: 10/16/2020, 12:09 PM  Clinical Narrative:                 Patient on day 6 COVID pneumonia, on 4LPM  now, still having SHOB on ambulation saturations in 88% range. RN will get oxygen qualifications today. MD ordered discharge medications so TOC phamracy can fill them. Patient has been to [facility] prior, she has listed them as her pharmacy. Has post COVID appointment at [facility], will refer back to [facility] for primary care. Cannot obtain HH due to no PCP at this time. , Called patient and asked if she could pay 8 dollars for medicine . She stated she could. The pharmacy will call her and the medication will be IN THE MAIN PHARMACY FOR DISCHARGE.FOR PICKUP.NO other needs identified.   1600: Oxygen qualifiers not done yet, secure chat to RN to see if they are going to do them today. We can order charity oxygen when qualifiers are done.   Expected Discharge Plan: Home/Self Care Barriers to Discharge: Inadequate or no insurance, Undocumented   Patient Goals and CMS Choice        Expected Discharge Plan and Services Expected Discharge Plan: Home/Self Care   Discharge Planning Services: CM Consult   Living arrangements for the past 2 months: Single Family Home                                      Prior Living Arrangements/Services Living arrangements for the past 2 months: Single Family Home Lives with:: Adult Children Patient language and need for interpreter reviewed:: Yes (spanish)        Need for Family Participation in Patient Care: Yes (Comment) Care giver support system in place?: Yes (comment)   Criminal Activity/Legal Involvement Pertinent to Current Situation/Hospitalization: No - Comment as needed  Activities of Daily Living Home Assistive  Devices/Equipment: None ADL Screening (condition at time of admission) Patient's cognitive ability adequate to safely complete daily activities?: Yes Is the patient deaf or have difficulty hearing?: No Does the patient have difficulty seeing, even when wearing glasses/contacts?: No Does the patient have difficulty concentrating, remembering, or making decisions?: No Patient able to express need for assistance with ADLs?: Yes Does the patient have difficulty dressing or bathing?: No Independently performs ADLs?: Yes (appropriate for developmental age) Does the patient have difficulty walking or climbing stairs?: No Weakness of Legs: Both Weakness of Arms/Hands: Both  Permission Sought/Granted                  Emotional Assessment       Orientation: : Oriented to Place, Oriented to Self, Oriented to  Time, Oriented to Situation Alcohol / Substance Use: Not Applicable Psych Involvement: No (comment)  Admission diagnosis:  Acute respiratory failure with hypoxia (HCC) [J96.01] Pneumonia due to COVID-19 virus [U07.1, J12.82] COVID-19 [U07.1] Patient Active Problem List   Diagnosis Date Noted  . BMI 50.0-59.9, adult (HCC)   . COVID-19 10/10/2020  . Acute respiratory failure with hypoxia (HCC)   . Pneumonia due to COVID-19 virus   . Obesity 04/04/2012  . History of gestational diabetes 04/04/2012   PCP:  Patient, No Pcp Per Pharmacy:   [facility] [phone] -  [place], [place] - [address] AT [facility] [address] [place] [address] Phone: [phone] Fax: [phone]  [facility] - [place], [place] - [address] [place] [address] Phone: [phone] Fax: [phone]  [facility] - [place], [place] - [address] [place] [address] Phone: [phone] Fax: [phone]     Social Determinants of Health (SDOH) Interventions     Readmission Risk Interventions No flowsheet data found.

## 2020-10-16 NOTE — Progress Notes (Signed)
Physical Therapy Treatment Patient Details Name: Cheryl Kelley MRN: [id number] DOB: [birth year] Today's Date: 10/16/2020    History of Present Illness Cheryl Kelley is a 44 y.o. female presenting with dyspnea and cough. PMH is significant for obesity and gestational diabetes.  Presents with acute hypoxic respiratory failure due to COVID-19.    PT Comments    Pt was able to increase gait distance today with slow speed but steady.  She was on 4 LPM O2 at rest with sats 93% and increased to 6 LPM for walking with sats 89%, back to 4 LPM once in chair.  Pt demonstrated mobility and safety well enough to perform transfers bed/chair on her own, continue to recommend assist for walking.  Will continue to advance mobility as able.     Follow Up Recommendations  Home health PT     Equipment Recommendations  None recommended by PT    Recommendations for Other Services       Precautions / Restrictions Precautions Precautions: Fall    Mobility  Bed Mobility Overal bed mobility: Needs Assistance Bed Mobility: Supine to Sit     Supine to sit: Supervision     General bed mobility comments: Supervision for safety and to manage lines; Used bedrails; able to get up without physical assist  Transfers Overall transfer level: Needs assistance Equipment used: None Transfers: Sit to/from Stand Sit to Stand: Supervision         General transfer comment: for safety due to multiple lines; stood x 3 reps during session  Ambulation/Gait Ambulation/Gait assistance: Min guard Gait Distance (Feet): 160 Feet Assistive device: None Gait Pattern/deviations: Step-through pattern;Decreased stride length;Wide base of support     General Gait Details: Ambulated from chair to door and back 4 x without rest break (stayed in room to be near interpretor).  Slow gait but steady, cues for pursed lip breathing   Stairs             Wheelchair Mobility    Modified Rankin (Stroke  Patients Only)       Balance Overall balance assessment: Needs assistance Sitting-balance support: Feet supported;No upper extremity supported Sitting balance-Leahy Scale: Good     Standing balance support: No upper extremity supported Standing balance-Leahy Scale: Good Standing balance comment: ambulated without AD                            Cognition Arousal/Alertness: Awake/alert Behavior During Therapy: WFL for tasks assessed/performed Overall Cognitive Status: Within Functional Limits for tasks assessed                                 General Comments: Used video interpretor: Cheryl Kelley      Exercises Other Exercises Other Exercises: IS x 5, pt pulling 750 with + cough Other Exercises: Flutter valve x 5 + cough    General Comments General comments (skin integrity, edema, etc.): Educated pt on correct use of incentive spirometer.  Also, discussed mobility has improved enough that she could do bed/chair transfers independently but still recommend assist for walking      Pertinent Vitals/Pain Pain Assessment: No/denies pain    Home Living                      Prior Function            PT Goals (current goals can now be found  in the care plan section) Acute Rehab PT Goals Patient Stated Goal: to go home, feel better PT Goal Formulation: With patient Time For Goal Achievement: 10/25/20 Potential to Achieve Goals: Good Progress towards PT goals: Progressing toward goals    Frequency    Min 3X/week      PT Plan Current plan remains appropriate    Co-evaluation              AM-PAC PT "6 Clicks" Mobility   Outcome Measure  Help needed turning from your back to your side while in a flat bed without using bedrails?: None Help needed moving from lying on your back to sitting on the side of a flat bed without using bedrails?: None Help needed moving to and from a bed to a chair (including a wheelchair)?:  None Help needed standing up from a chair using your arms (e.g., wheelchair or bedside chair)?: None Help needed to walk in hospital room?: A Little Help needed climbing 3-5 steps with a railing? : A Little 6 Click Score: 22    End of Session Equipment Utilized During Treatment: Oxygen Activity Tolerance: Patient tolerated treatment well Patient left: in chair;with call bell/phone within reach Nurse Communication: Mobility status PT Visit Diagnosis: Other abnormalities of gait and mobility (R26.89);Muscle weakness (generalized) (M62.81)     Time: 5465-6812 PT Time Calculation (min) (ACUTE ONLY): 23 min  Charges:  $Gait Training: 8-22 mins $Therapeutic Activity: 8-22 mins                     Cheryl Kelley, PT Acute Rehab Services Pager [phone] [facility] [phone]     Rayetta Humphrey 10/16/2020, 1:06 PM

## 2020-10-17 DIAGNOSIS — Z6841 Body Mass Index (BMI) 40.0 and over, adult: Secondary | ICD-10-CM | POA: Diagnosis not present

## 2020-10-17 DIAGNOSIS — J1282 Pneumonia due to coronavirus disease 2019: Secondary | ICD-10-CM | POA: Diagnosis not present

## 2020-10-17 DIAGNOSIS — J9601 Acute respiratory failure with hypoxia: Secondary | ICD-10-CM | POA: Diagnosis not present

## 2020-10-17 DIAGNOSIS — U071 COVID-19: Secondary | ICD-10-CM | POA: Diagnosis not present

## 2020-10-17 LAB — COMPREHENSIVE METABOLIC PANEL
ALT: 45 U/L — ABNORMAL HIGH (ref 0–44)
AST: 38 U/L (ref 15–41)
Albumin: 2.8 g/dL — ABNORMAL LOW (ref 3.5–5.0)
Alkaline Phosphatase: 77 U/L (ref 38–126)
Anion gap: 7 (ref 5–15)
BUN: 12 mg/dL (ref 6–20)
CO2: 24 mmol/L (ref 22–32)
Calcium: 8.5 mg/dL — ABNORMAL LOW (ref 8.9–10.3)
Chloride: 104 mmol/L (ref 98–111)
Creatinine, Ser: 0.49 mg/dL (ref 0.44–1.00)
GFR, Estimated: >60 mL/min (ref >60)
Glucose, Bld: 182 mg/dL — ABNORMAL HIGH (ref 70–99)
Potassium: 4.5 mmol/L (ref 3.5–5.1)
Sodium: 135 mmol/L (ref 135–145)
Total Bilirubin: 0.7 mg/dL (ref 0.3–1.2)
Total Protein: 6.6 g/dL (ref 6.5–8.1)

## 2020-10-17 LAB — GLUCOSE, CAPILLARY
Glucose-Capillary: 126 mg/dL — ABNORMAL HIGH (ref 70–99)
Glucose-Capillary: 122 mg/dL — ABNORMAL HIGH (ref 70–99)
Glucose-Capillary: 247 mg/dL — ABNORMAL HIGH (ref 70–99)
Glucose-Capillary: 223 mg/dL — ABNORMAL HIGH (ref 70–99)

## 2020-10-17 NOTE — Progress Notes (Signed)
Family Medicine Teaching Service Daily Progress Note Intern Pager: [phone]  Patient name: Cheryl Kelley Medical record number: [id number] Date of birth: 23-Apr-1976 Age: 44 y.o. Gender: female  Primary Care Provider: Patient, No Pcp Per Consultants: None Code Status: Full  Pt Overview and Major Events to Date:  Admitted 11/6  Assessment and Plan: Cheryl Rosas-Floresis a 44 y.o.femalepresenting with dyspneaand cough. PMH is significant forobesity and gestational diabetes.  Acute Hypoxic Respiratory Failure 2/2 COVID-19 Satting above 95 overnight.ALT mildly elevated at 45.Patient indicates overallbreathing is improved and has been doing better with breathing while ambulating.  Stable on 4L lying down.  Went up to 6L while ambulating and desaturated to 89% per PT.  Overall continuing to improve.  -Vitals per routine, OOB with assistance only, regular diet -Airborne and contact precautions -Close monitoring of respiratory status -Wean supplemental O2 astolerated, Goal SpO2 >90% -Continuous pulse ox -Decadron day 8 of 10 -DailyCMP -Tessalon 200mg  TID prn for cough -Tylenol 1000 mg q6hr for back apin - PT/OT seeing patient - Hopeful D/C tomorrow on O2 at home for COVID, if can tolerate <6L O2 with O2 >90% - Will be COVID cleared on11/16, can follow-up in clinic   Hyperglycemia A1C- 7.9. Blood sugarsincreased to 246 last night. 126 this morning.No further episodes of diziness. -Monitoring blood sugars with morning BMPsglucose checks with meals -ContinuesSSI given A1C and decadron treatment --ContinueLinagliptin 5 mg - Plan on starting metformin at D/C  FEN/GI:Regular diet Prophylaxis:Lovenox   Status is: Inpatient  Remains inpatient appropriate because:Inpatient level of care appropriate due to severity of illness   Dispo:  Patient From: Home  Planned Disposition: Home  Expected discharge date: 10/19/20  Medically stable for discharge:  No     Subjective:  Patient indicates seh feels well today and breathing has continued to improve.  Has no other complaints or concerns at this time.  Objective: Temp:  [98.1 F (36.7 C)-98.5 F (36.9 C)] 98.1 F (36.7 C) (11/13 0551) Pulse Rate:  [65-99] 85 (11/13 0551) Resp:  [20] 20 (11/13 0551) BP: (112-123)/(61-77) 115/70 (11/13 0551) SpO2:  [96 %] 96 % (11/13 0551) Physical Exam:  Physical Exam Constitutional:      Appearance: Normal appearance.  HENT:     Head: Normocephalic and atraumatic.     Mouth/Throat:     Mouth: Mucous membranes are moist.  Cardiovascular:     Rate and Rhythm: Normal rate and regular rhythm.  Pulmonary:     Effort: Pulmonary effort is normal.     Breath sounds: Normal breath sounds.  Skin:    General: Skin is warm.  Neurological:     General: No focal deficit present.     Mental Status: She is alert.     Laboratory: Recent Labs  Lab 10/11/20 0305 10/12/20 0340 10/16/20 0620  WBC 4.1 6.7 8.5  HGB 13.6 13.8 13.4  HCT 42.7 43.4 41.3  PLT 296 337 317   Recent Labs  Lab 10/15/20 0126 10/16/20 0019 10/17/20 0109  NA 136 134* 135  K 4.0 4.1 4.5  CL 104 102 104  CO2 25 22 24   BUN 17 12 12   CREATININE 0.59 0.45 0.49  CALCIUM 8.3* 8.7* 8.5*  PROT 6.5 6.7 6.6  BILITOT 0.8 0.7 0.7  ALKPHOS 65 73 77  ALT 45* 43 45*  AST 62* 41 38  GLUCOSE 125* 160* 182*      Imaging/Diagnostic Tests: No new imaging  10/19/20, MD 10/17/2020, 8:51 AM PGY-1, [facility] Family Medicine FPTS Intern  pager: [phone], text pages welcome

## 2020-10-17 NOTE — Hospital Course (Addendum)
Cheryl Kelley is a 44 y.o. female presenting with dyspnea and cough. PMH is significant for obesity and gestational diabetes.  Acute Hypoxic Respiratory Failure 2/2 COVID-19 First started having symptoms on 10/26.  Tested positive for COVID-19 on 10/31.  Initially was on 10 L of HFNC + 10L of non-rebreather mask.  Was bale to be weaned off non-rebreather and gradually weaned down to room air by time of discharge  Able to tolerate PO throughout stay.  Also had back pain likely due to Viral Myositis that resolved with Tylenol.  Received 5 day course of remdesevir.  Also given Decadron and received 9 doses while hospitalized, with the rest of 10 day course to be completed over time.  D-Dimer and CRP were elevated initially and trended down to normal levels.  Patient cleared by PT/OT with recs for home health PT at time of discharge.  Patient did not require O2 at time of discharge  Hyperglycemia Patient initially presented with A1C of 7.9.  Was started on sSSI while on decadron.   Also started on Linagliptin 5 mg for COVID-19, stopped upon D/C.  Likely Type 2 Diabetes though will need repeat A1C in 3 months to confirm.  Start Metformin on D/C, and scheduled for outpatient follow up at [facility].

## 2020-10-17 NOTE — Care Management (Signed)
Requested ambulatory sats from nurse; Patient has order for home oxygen.

## 2020-10-17 NOTE — Discharge Summary (Addendum)
Family Medicine Teaching [facility] Discharge Summary  Patient name: Cheryl Kelley Medical record number: [id number] Date of birth: 07-Jan-1976 Age: 44 y.o. Gender: female Date of Admission: 10/10/2020  Date of Discharge: 10/18/20 Admitting Physician: Latrelle Dodrill, MD  Primary Care Provider: Patient, No Pcp Per Consultants: None  Indication for Hospitalization: Acute Hypoxic Respiratory Failure 2/2 COVID-19  Discharge Diagnoses/Problem List:  Patient Active Problem List   Diagnosis Date Noted  . Diabetes mellitus without complication (HCC) 10/18/2020  . BMI 50.0-59.9, adult (HCC)   . COVID-19 10/10/2020  . Acute respiratory failure with hypoxia (HCC)   . Pneumonia due to COVID-19 virus   . Obesity 04/04/2012  . History of gestational diabetes 04/04/2012   Disposition: Home with home health PT   Discharge Condition: Stable  Discharge Exam:  See daily progress note for discharge exam   Brief Hospital Course:  Cheryl Kelley is a 44 y.o. female presenting with dyspnea and cough. PMH is significant for obesity and gestational diabetes.  Acute Hypoxic Respiratory Failure 2/2 COVID-19 First started having symptoms on 10/26.  Tested positive for COVID-19 on 10/31.  Initially was on 10 L of HFNC + 10L of non-rebreather mask.  Was bale to be weaned off non-rebreather and gradually weaned down to room air by time of discharge  Able to tolerate PO throughout stay.  Also had back pain likely due to Viral Myositis that resolved with Tylenol.  Received 5 day course of remdesevir.  Also given Decadron and received 9 doses while hospitalized, with the rest of 10 day course to be completed over time.  D-Dimer and CRP were elevated initially and trended down to normal levels.  Patient cleared by PT/OT with recs for home health PT at time of discharge.  Patient did not require O2 at time of discharge  Hyperglycemia Patient initially presented with A1C of 7.9.  Was started on  sSSI while on decadron.   Also started on Linagliptin 5 mg for COVID-19, stopped upon D/C.  Likely Type 2 Diabetes though will need repeat A1C in 3 months to confirm.  Start Metformin on D/C, and scheduled for outpatient follow up at [facility].   Issues for Follow Up:  1. COVID-19-  Ensure that symptoms are continuing to improve.  Considered COVID cleared per CDC guidelines on 11/16. 2. Patient scheduled for follow up in [facility] clinic on 10/19/20 by Dr. Lum Babe 3. Hyperglycemia-  Likely that patient has Type 2 Diabetes Mellitis with A1C of 7.9.  Will need outpatient follow-up and education after starting Metformin 500 mg daily on discharge. 4. Establish Care- Can establish care with Dr Pecola Leisure going forward if so desires.  Significant Procedures: None  Significant Labs and Imaging:  Recent Labs  Lab 10/12/20 0340 10/16/20 0620  WBC 6.7 8.5  HGB 13.8 13.4  HCT 43.4 41.3  PLT 337 317   Recent Labs  Lab 10/13/20 0036 10/13/20 0036 10/14/20 0019 10/14/20 0019 10/15/20 0126 10/15/20 0126 10/16/20 0019 10/17/20 0109  NA 137  --  136  --  136  --  134* 135  K 4.0   < > 4.2   < > 4.0   < > 4.1 4.5  CL 105  --  105  --  104  --  102 104  CO2 22  --  22  --  25  --  22 24  GLUCOSE 228*  --  161*  --  125*  --  160* 182*  BUN 16  --  14  --  17  --  12 12  CREATININE 0.48  --  0.43*  --  0.59  --  0.45 0.49  CALCIUM 8.4*  --  8.3*  --  8.3*  --  8.7* 8.5*  ALKPHOS 60  --  68  --  65  --  73 77  AST 37  --  31  --  62*  --  41 38  ALT 38  --  35  --  45*  --  43 45*  ALBUMIN 2.6*  --  2.6*  --  2.5*  --  2.7* 2.8*   < > = values in this interval not displayed.    Results/Tests Pending at Time of Discharge: None  Discharge Medications:  Allergies as of 10/18/2020   No Known Allergies     Medication List    STOP taking these medications   Advil Liqui-Gels minis 200 MG Caps Generic drug: Ibuprofen   loratadine 10 MG tablet Commonly known as: CLARITIN   methylPREDNISolone 4 MG  tablet Commonly known as: MEDROL   neomycin-polymyxin-hydrocortisone OTIC solution Commonly known as: CORTISPORIN   QC NIGHTTIME COLD & FLU PO     TAKE these medications   albuterol 108 (90 Base) MCG/ACT inhaler Commonly known as: VENTOLIN HFA Inhale 2 puffs into the lungs every 4 (four) hours as needed for wheezing or shortness of breath.   benzonatate 200 MG capsule Commonly known as: TESSALON Take 200 mg by mouth in the morning, at noon, and at bedtime.   dexamethasone 6 MG tablet Commonly known as: DECADRON Take 1 tablet (6 mg total) by mouth daily.   metFORMIN 500 MG 24 hr tablet Commonly known as: Glucophage XR Take 1 tablet (500 mg total) by mouth daily with breakfast.            Durable Medical Equipment  (From admission, onward)         Start     Ordered   10/16/20 1032  For home use only DME oxygen  Once       Question Answer Comment  Length of Need 6 Months   Oxygen delivery system Gas      10/16/20 1032   10/15/20 1228  For home use only DME Walker rolling  Once       Question Answer Comment  Walker: With 5 Inch Wheels   Patient needs a walker to treat with the following condition Weakness      10/15/20 1229   10/15/20 1226  For home use only DME 3 n 1  Once       Comments: Bariatric   10/15/20 1229          Discharge Instructions: Please refer to Patient Instructions section of EMR for full details.  Patient was counseled important signs and symptoms that should prompt return to medical care, changes in medications, dietary instructions, activity restrictions, and follow up appointments.   Follow-Up Appointments: Future Appointments  Date Time Provider Department Center  10/20/2020  3:50 PM ACCESS TO CARE POOL [facility]    Melene Plan, MD 10/18/2020, 1:18 PM PGY-3, [facility] Family [facility]

## 2020-10-18 DIAGNOSIS — U071 COVID-19: Secondary | ICD-10-CM | POA: Diagnosis not present

## 2020-10-18 DIAGNOSIS — J9601 Acute respiratory failure with hypoxia: Secondary | ICD-10-CM | POA: Diagnosis not present

## 2020-10-18 DIAGNOSIS — E119 Type 2 diabetes mellitus without complications: Secondary | ICD-10-CM | POA: Diagnosis not present

## 2020-10-18 DIAGNOSIS — Z6841 Body Mass Index (BMI) 40.0 and over, adult: Secondary | ICD-10-CM | POA: Diagnosis not present

## 2020-10-18 LAB — GLUCOSE, CAPILLARY
Glucose-Capillary: 123 mg/dL — ABNORMAL HIGH (ref 70–99)
Glucose-Capillary: 179 mg/dL — ABNORMAL HIGH (ref 70–99)

## 2020-10-18 NOTE — Care Management (Signed)
Spoke w resident who states the patient will follow up in their clinic. Messaged nurse that meds for DC have been filled are in main pharmacy.  Does not qualify for home O2. No other CM needs identified

## 2020-10-18 NOTE — Discharge Instructions (Addendum)
Dear Cheryl Kelley,   Thank you for letting us participate in your care! In this section, you will find a brief hospital admission summary of why you were admitted to the hospital, what happened during your admission, your diagnosis/diagnoses, and recommended follow up.   You were admitted because you were experiencing difficulty breathing.   You were diagnosed with COVID-19.  You were treated with supplemental O2, Remdesevir, and decadron.  Your breathing improved and you were discharged from the hospital with oxygen to continue to help with your breathing.  POST-HOSPITAL & CARE INSTRUCTIONS 1. You may have a lingering cough and difficulty breathing but this should continue to get better 2. You have one more day of decadron. Please take your last dose on 10/19/20.  3. Your blood sugar was found to be high in the hospital, we are starting you on Metformin, this can cuase upset stomach and we recommend taking with meals. 4. Please follow-up with [place] in our clinic on 10/20/20 at 3:50 PM.  We recommend establishing with A primary care doctor, you can establish with [place] if you wish. 5. Please see medications section of this packet for any medication changes.   DOCTOR'S APPOINTMENT & FOLLOW UP CARE INSTRUCTIONS  Future Appointments  Date Time Provider Department Center  10/20/2020  3:50 PM ACCESS TO CARE POOL [facility]  Family Medicine Clinic Address is:  [address] [place] [address]  [phone]  Thank you for choosing [facility]! Take care and be well!  Family Medicine Teaching Service Inpatient Team [place]  [facility]  [address] [place], [place] [address] [phone]    COVID-19  El COVID-19 es una infeccin respiratoria causada por un virus llamado coronavirus tipo 2 causante del sndrome respiratorio agudo grave (SARS-CoV-2). La enfermedad tambin se conoce como enfermedad por coronavirus o nuevo coronavirus. En algunas  personas, el virus puede no ocasionar sntomas. En otras, puede producir una infeccin grave. La infeccin puede empeorar rpidamente y causar complicaciones, como:  Neumona o infeccin en los pulmones.  Sndrome de dificultad respiratoria aguda o SDRA. Es una afeccin que se caracteriza por la acumulacin de lquido en los pulmones, que impide que los pulmones se llenen de aire y pasen oxgeno a Risk manager.  Insuficiencia respiratoria aguda. Es una afeccin que se caracteriza porque no pasa suficiente oxgeno de los pulmones al cuerpo o porque el dixido de carbono no pasa de los pulmones hacia afuera del cuerpo.  Sepsis o choque sptico. Se trata de una reaccin grave del cuerpo ante una infeccin.  Problemas de coagulacin.  Infecciones secundarias debido a bacterias u hongos.  Falla de rganos. Ocurre cuando los rganos del cuerpo dejan de funcionar. El virus que causa el COVID-19 es contagioso. Esto significa que puede transmitirse de [place] persona a otra a travs de las gotitas de saliva de la tos y de los estornudos (secreciones respiratorias). Cules son las causas? Esta enfermedad es causada por un virus. Usted puede contagiarse con este virus:  Al inspirar las gotitas de una persona infectada. Las BJ's pueden diseminarse cuando una persona respira, habla, canta, tose o estornuda.  Al tocar algo, como una mesa o el picaportes de [place], que estuvo expuesto al virus (contaminado) y luego tocarse la boca, nariz o los ojos. Qu incrementa el riesgo? Riesgo de infeccin Es ms probable que se infecte con este virus si:  Se encuentra a Social worker a 6 pies (2 metros) de Physiological scientist  persona con COVID-19.  Cuida o vive con una persona infectada con COVID-19.  Pasa tiempo en espacios interiores repletos de gente o vive en viviendas compartidas. Riesgo de enfermedad grave Es ms probable que se enferme gravemente por el virus si:  Tiene 50aos o ms. Cuanto mayor sea su  edad, mayor ser el riesgo de tener una enfermedad grave.  Vive en un hogar de ancianos o centro de atencin a Air cabin crew.  Tiene cncer.  Tiene una enfermedad prolongada (crnica), como las siguientes: ? Enfermedad pulmonar crnica, que incluye la enfermedad pulmonar obstructiva crnica o asma. ? Una enfermedad crnica que disminuye la capacidad del cuerpo para combatir las infecciones (immunocomprometido). ? Enfermedad cardaca, que incluye insuficiencia cardaca, una afeccin que se caracteriza porque las arterias que llegan al corazn se Engineer, technical sales u obstruyen (arteriopata coronaria) o una enfermedad que provoca que el msculo cardaco se engrose, se debilite o endurezca (miocardiopata). ? Diabetes. ? Enfermedad renal crnica. ? Anemia drepanoctica, una enfermedad que se caracteriza porque los glbulos rojos tienen una forma anormal de hoz. ? Enfermedad heptica.  Es obeso. Cules son los signos o sntomas? Los sntomas de esta afeccin pueden ser de leves a graves. Los sntomas pueden aparecer en el trmino de 2 a [address] despus de haber estado expuesto al virus. Incluyen los siguientes:  Fiebre o escalofros.  Tos.  Dificultad para respirar.  Dolores de [place], dolores en el cuerpo o dolores musculares.  Secrecin o congestin nasal.  Dolor de garganta.  Nueva prdida del sentido del gusto o del olfato. Algunas personas tambin pueden Mattel, como nuseas, vmitos o diarrea. Es posible que otras personas no tengan sntomas de COVID-19. Cmo se diagnostica? Esta afeccin se puede diagnosticar en funcin de lo siguiente:  Sus signos y sntomas, especialmente si: ? Vive en una zona donde hay un brote de COVID-19. ? Viaj recientemente a una zona donde el virus es frecuente. ? Cuida o vive con Neomia Dear persona a quien se le diagnostic COVID-19. ? Odelia Gage expuesto a una persona a la que se le diagnostic COVID-19.  Un examen fsico.  Anlisis de  laboratorio que pueden incluir: ? Tomar una muestra de lquido de la parte posterior de la nariz y la garganta (lquido nasofarngeo), la nariz o la garganta, con un hisopo. ? Una muestra de mucosidad de los pulmones (esputo). ? Anlisis de [place].  Los estudios de diagnstico por imgenes pueden incluir radiografas, exploracin por tomografa computarizada (TC) o ecografa. Cmo se trata? En este momento, no hay ningn medicamento para tratar el COVID-19. Los medicamentos para tratar otras enfermedades se usan a modo de ensayo para comprobar si son eficaces contra el COVID-19. El mdico le informar sobre las maneras de tratar los sntomas. En la [employer], la infeccin es leve y puede controlarse en el hogar con reposo, lquidos y medicamentos de [place]. El tratamiento para una infeccin grave suele realizarse en la unidad de cuidados intensivos (UCI) de un hospital. Puede incluir uno o ms de los siguientes. Estos tratamientos se administran hasta que los sntomas mejoran.  Recibir lquidos y [employer] a travs de una va intravenosa.  Oxgeno complementario. Para administrar oxgeno extra, se [place] un tubo en la Darene Lamer, una mascarilla o una campana de oxgeno.  Colocarlo para que se recueste boca abajo (decbito prono). Esto facilita el ingreso de oxgeno a los pulmones.  Uso continuo de [place] de presin positiva de las vas areas (CPAP) o de presin positiva de las vas  areas de [employer] Pal[employer] (BPAP). Este tratamiento utiliza una presin de aire leve para Pharmacologist las vas respiratorias abiertas. Un tubo conectado a un motor administra oxgeno al cuerpo.  Respirador. Este tratamiento mueve el aire dentro y fuera de los pulmones mediante el uso de un tubo que se coloca en la trquea.  Traqueostoma. En este procedimiento se hace un orificio en el cuello para insertar un tubo de respiracin.  Oxigenacin por membrana extracorprea (OMEC). En este procedimiento,  los pulmones tienen la posibilidad de recuperarse al asumir las funciones del corazn y los pulmones. Suministra oxgeno al cuerpo y elimina el dixido de carbono. Siga estas instrucciones en su casa: Estilo de vida  Si est enfermo, qudese en su casa, excepto para obtener atencin mdica. El mdico le indicar cunto tiempo debe quedarse en casa. Llame al mdico antes de buscar atencin mdica.  Haga reposo en su casa como se lo haya indicado el mdico.  No consuma ningn producto que contenga nicotina o tabaco, como cigarrillos, cigarrillos electrnicos y tabaco de Theatre manager. Si necesita ayuda para dejar de fumar, consulte al mdico.  Retome sus actividades normales segn lo indicado por el mdico. Pregntele al mdico qu actividades son seguras para usted. Instrucciones generales  Use los medicamentos de venta libre y los recetados solamente como se lo haya indicado el mdico.  Beba suficiente lquido como para Pharmacologist la orina de color amarillo plido.  Concurra a todas las visitas de [address] se lo haya indicado el mdico. Esto es importante. Cmo se evita?  No hay ninguna vacuna que ayude a prevenir la infeccin por COVID-19. Sin embargo, hay medidas que puede tomar para protegerse y Conservator, museum/gallery a Economist de este virus. Para protegerse:   No viaje a zonas donde el COVID-19 sea un riesgo. Las zonas donde se informa la presencia del COVID-19 cambian con frecuencia. Para identificar las zonas de alto riesgo y las restricciones de viaje, consulte el sitio web de viajes de Building control surveyor for [employer] Technology and Prevention Insurance claims handler) (Centros para el Control y la Prevencin de Event organiser): StageSync.si  Si vive o debe viajar a una zona donde el COVID-19 es un riesgo, tome precauciones para evitar infecciones. ? Aljese de [employer]el[employer] Corporation. ? Lvese las manos frecuentemente con agua y [place]. Use desinfectante para manos con alcohol si no  dispone de [place] y [place]. ? Evite tocarse la boca, la cara, los ojos o la [place]. ? Evite salir de su casa, siga las indicaciones de su estado y de las autoridades sanitarias locales. ? Si debe salir de su casa, use un barbijo de tela o una mascarilla facial. Asegrese de que le cubra la nariz y la boca. ? Evite los espacios interiores repletos de gente. Mantenga una distancia de al menos 6 pies (2 metros) de Economist. ? Desinfecte los objetos y las superficies que se tocan con frecuencia todos [place]. Pueden incluir:  Encimeras y [place].  Picaportes e interruptores de luz.  Lavabos, fregaderos y grifos.  Aparatos electrnicos tales como telfonos, controles remotos, teclados, computadoras y tabletas. Cmo proteger a los dems: Si tiene sntomas de COVID-19, tome medidas para evitar que el virus se propague a Economist.  Si cree que tiene una infeccin por COVID-19, comunquese de inmediato con su mdico. Informe al equipo de atencin mdica que cree que puede tener una infeccin por el COVID-19.  Qudese en su casa. Salga de su casa solo para buscar atencin mdica. No utilice el transporte pblico.  No viaje mientras est enfermo.  Lvese las manos frecuentemente con agua y [place]n durante 20segundos. Usar desinfectante para manos con alcohol si no dispone de [place] y [place].  Mantngase alejado de quienes vivan con usted. Permita que los miembros de la familia sanos cuiden a los nios y las [place]mascotas, si es posible. Si tiene que cuidar a los nios o las mascotas, lvese las manos con frecuencia y use un barbijo. Si es posible, permanezca en su habitacin, separado de los dems. Utilice un bao diferente.  Asegrese de que todas las personas que viven en su casa se laven bien las manos y con frecuencia.  Tosa o estornude en un pauelo de papel o sobre su manga o codo. No tosa o estornude al aire ni se cubra la boca o la nariz con la [place].  Use un barbijo de tela o una mascarilla  facial. Asegrese de que le cubra la nariz y la boca. Dnde buscar ms informacin  Centers for Disease Control and Prevention (Centros para el Control y la Prevencin de Event organisernfermedades): StickerEmporium.tnwww.cdc.gov/coronavirus/2019-ncov/index.html  World Health Organization (Organizacin Mundial de la Salud): https://thompson-craig.com/www.who.int/health-topics/coronavirus Comunquese con un mdico si:  Vive o ha viajado a una zona donde el COVID-19 es un riesgo y tiene sntomas de infeccin.  Ha tenido contacto con alguien que tiene COVID-19 y usted tiene sntomas de infeccin. Solicite ayuda inmediatamente si:  Tiene dificultad para respirar.  Siente dolor u opresin en el pecho.  Experimenta confusin.  Tiene las uas de los dedos y los labios de color Ebonyazulado.  Tiene dificultad para despertarse.  Los sntomas empeoran. Estos sntomas pueden representar un problema grave que constituye Radio broadcast assistantuna emergencia. No espere a ver si los sntomas desaparecen. Solicite atencin mdica de inmediato. Comunquese con el servicio de emergencias de su localidad (911 en los [place]). No conduzca por sus propios medios Dollar Generalhasta el hospital. Informe al personal mdico de emergencias si cree que tiene COVID-19. Resumen  El COVID-19 es una infeccin respiratoria causada por un virus. Tambin se conoce como enfermedad por coronavirus o nuevo coronavirus. Puede causar infecciones graves, como neumona, sndrome de dificultad respiratoria aguda, insuficiencia respiratoria aguda o sepsis.  El virus que causa el COVID-19 es contagioso. Esto significa que puede transmitirse de [place] persona a otra a travs de las gotitas que se despiden al respirar, Heritage managerhablar, cantar, toser y Engineering geologistestornudar.  Es ms probable que desarrolle una enfermedad grave si tiene 50 aos o ms, tiene el sistema inmunitario dbil, vive en un hogar de ancianos o tiene una enfermedad crnica.  No hay ningn medicamento para tratar el COVID-19. El mdico le informar sobre las maneras de  tratar los sntomas.  Tome medidas para protegerse y Conservator, museum/galleryproteger a los Merchandiser, retaildems contra las infecciones. Lvese las manos con frecuencia y desinfecte los objetos y las superficies que se tocan con frecuencia todos [place] das. Mantngase alejado de las personas que estn enfermas y use un barbijo si est enfermo. Esta informacin no tiene Theme park managercomo fin reemplazar el consejo del mdico. Asegrese de hacerle al mdico cualquier pregunta que tenga. Document Revised: 09/26/2019 Document Reviewed: 01/19/2019 Elsevier Patient Education  2020 [employer] ==============================================

## 2020-10-18 NOTE — Progress Notes (Signed)
Family Medicine Teaching Service Daily Progress Note Intern Pager: [phone]  Patient name: Cheryl Kelley Medical record number: [id number] Date of birth: 10-05-1976 Age: 44 y.o. Gender: female  Primary Care Provider: Patient, No Pcp Per Consultants: none  Code Status: Full Code   Pt Overview and Major Events to Date:  Hospital Day: 9 10/10/2020: admitted for COVID   11/6-11/10: remdesivir   11/5-11/15: decadron   Assessment and Plan: Cheryl Kelley is a 44 y.o. female who presented w/ dyspnea 2/2 COVID.  PMHx s/f obesity and GDM.   Acute hypoxic respiratory failure 2/2 COVID 19  Overall improving.  -Home DME O2 ordered in anticipation for discharge.  -Day 9 of decadron (TOC meds ordered for day 10 if discharge today) -s/p Remdesevir   Diabetes  A1C 7.9 on admission. 5 units aspart total yesterday. CBGs elevated during admission; however, also on daily dexamethasone for COVID 19.  - Linagliptin daily  - Plans to discharge on metformin with outpatient follow up   FEN/GI: Regular diet.  Access: R PIV (10/10/20) VTE prophylaxis: Lovenox 65 mg   Disposition: Home with HH    Subjective:  NAEO.   Objective: Temp:  [97.9 F (36.6 C)-98.9 F (37.2 C)] 98.9 F (37.2 C) (11/14 0505) Pulse Rate:  [60-80] 80 (11/14 0600) Cardiac Rhythm: Sinus bradycardia (11/14 0700) Resp:  [18-20] 19 (11/14 0600) BP: (88-112)/(47-74) 88/53 (11/14 0505) SpO2:  [93 %-97 %] 95 % (11/14 0600) Intake/Output      11/13 0701 - 11/14 0700 11/14 0701 - 11/15 0700   P.O. 600    Total Intake(mL/kg) 600 (4.5)    Net +600         Urine Occurrence 1 x        Physical Exam: General: NAD, non-toxic, well-appearing, sitting comfortably in chair   HEENT: [place]/AT. PERRLA. EOMI.  Cardiovascular: RRR, normal S1, S2. B/L 2+ RP. No BLEE Respiratory: CTAB. No IWOB.  Abdomen: + BS. NT, ND, soft to palpation.  Extremities: Warm and well perfused. Moving spontaneously.  Neuro: CN grossly intact. No  FND  Laboratory: I have personally read and reviewed all labs and imaging studies.  CBC: Recent Labs  Lab 10/12/20 0340 10/16/20 0620  WBC 6.7 8.5  NEUTROABS 5.4  --   HGB 13.8 13.4  HCT 43.4 41.3  MCV 85.9 84.6  PLT 337 317   CMP: Recent Labs  Lab 10/15/20 0126 10/16/20 0019 10/17/20 0109  NA 136 134* 135  K 4.0 4.1 4.5  CL 104 102 104  CO2 25 22 24   GLUCOSE 125* 160* 182*  BUN 17 12 12   CREATININE 0.59 0.45 0.49  CALCIUM 8.3* 8.7* 8.5*  ALBUMIN 2.5* 2.7* 2.8*   CBG: Recent Labs  Lab 10/17/20 0721 10/17/20 1218 10/17/20 1540 10/17/20 2137 10/18/20 0734  GLUCAP 126* 122* 247* 223* 123*    Imaging/Diagnostic Tests: No results found.  Procedures: none  2138, MD 10/18/2020, 8:50 AM PGY-3, [facility] FPTS Intern pager: [phone], text pages welcome

## 2020-10-18 NOTE — Progress Notes (Signed)
Cheryl Kelley to be D/C'd Home per MD order.  Discussed with the patient and all questions fully answered.  VSS, Skin clean, dry and intact without evidence of skin break down, no evidence of skin tears noted. IV catheter discontinued intact. Site without signs and symptoms of complications. Dressing and pressure applied.  An After Visit Summary was printed and given to the patient. Patient received prescription medications from the main pharmacy.  D/c education completed with patient/family including follow up instructions, medication list, d/c activities limitations if indicated, with other d/c instructions as indicated by MD - patient able to verbalize understanding, all questions fully answered.   Patient instructed to return to ED, call 911, or call MD for any changes in condition.   Patient escorted via WC, and D/C home via private auto.  [name] 10/18/2020 4:35 PM

## 2020-10-18 NOTE — Progress Notes (Signed)
SATURATION QUALIFICATIONS: (This note is used to comply with regulatory documentation for home oxygen)  Patient Saturations on Room Air at Rest = 98%  Patient Saturations on Room Air while Ambulating = 93%  Patient Saturations on 0 Liters of oxygen while Ambulating = 93%  Please briefly explain why patient needs home oxygen: Patient does not qualify for at home oxygen 

## 2020-10-20 ENCOUNTER — Ambulatory Visit: Payer: Self-pay

## 2020-10-20 ENCOUNTER — Ambulatory Visit (INDEPENDENT_AMBULATORY_CARE_PROVIDER_SITE_OTHER): Payer: HRSA Program | Admitting: Student in an Organized Health Care Education/Training Program

## 2020-10-20 ENCOUNTER — Other Ambulatory Visit: Payer: Self-pay

## 2020-10-20 DIAGNOSIS — J1282 Pneumonia due to coronavirus disease 2019: Secondary | ICD-10-CM

## 2020-10-20 DIAGNOSIS — E119 Type 2 diabetes mellitus without complications: Secondary | ICD-10-CM | POA: Diagnosis not present

## 2020-10-20 DIAGNOSIS — U071 COVID-19: Secondary | ICD-10-CM | POA: Diagnosis not present

## 2020-10-20 MED ORDER — HYDROXYZINE HCL 10 MG PO TABS: 10.0000 mg | ORAL_TABLET | Freq: Three times a day (TID) | ORAL | 0 refills | Status: AC | PRN

## 2020-10-20 MED ORDER — METFORMIN HCL ER 500 MG PO TB24: 500.0000 mg | ORAL_TABLET | Freq: Two times a day (BID) | ORAL | 0 refills | Status: AC

## 2020-10-20 NOTE — Assessment & Plan Note (Addendum)
Recovering well overall, per patient. Pulse ox 92% after ambulating to exam room. Denies dyspnea or chest pain. Recommended she take honey and/or the prescribed tessalon pearles from discharge for her residual cough. Provided patient with much reassurance as she has anxiety about returning of her respiratory failure.  - prescribed atarax

## 2020-10-20 NOTE — Progress Notes (Signed)
° °  SUBJECTIVE:   CHIEF COMPLAINT / HPI: hospital f/u for covid acute hypoxic respiratory failure.   COVID-19 hospitalization f/u-  Have been doing ok for the most part but sometimes having anxiety about the shortness of breath returning. Worse at night when going falling asleep. Getting fatigued when walking but much better than when at the hospitalization.   Diabetes-  Taking metformin every day in the morning. Denies N/V, diarrhea. She is open to taking this twice per day.   OBJECTIVE:   BP 102/68    Pulse 92    Ht 5\' 1"  (1.549 m)    SpO2 92%    BMI 55.61 kg/m   General: NAD, pleasant, able to participate in exam Cardiac: RRR, normal heart sounds, no murmurs. 2+ radial and PT pulses bilaterally Respiratory: CTAB, normal effort, No wheezes, rales or rhonchi. Intermittent cough with clear saliva production. Extremities: no edema. WWP. Skin: warm and dry, no rashes noted Neuro: alert and oriented, no focal deficits Psych: anxious affect and mood. Tearful when discussing fears of returning of her infection.   ASSESSMENT/PLAN:   Diabetes mellitus without complication (HCC) No negative SE on metformin Increased metformin to BID 500mg  XR.  F/u 3 months for repeat A1c Consider statin and ACEi   Pneumonia due to COVID-19 virus Recovering well overall, per patient. Pulse ox 92% after ambulating to exam room. Denies dyspnea or chest pain. Recommended she take honey and/or the prescribed tessalon pearles from discharge for her residual cough. Provided patient with much reassurance as she has anxiety about returning of her respiratory failure.  - prescribed atarax     , DO [facility]

## 2020-10-20 NOTE — Assessment & Plan Note (Signed)
No negative SE on metformin Increased metformin to BID 500mg  XR.  F/u 3 months for repeat A1c Consider statin and ACEi

## 2020-10-20 NOTE — Patient Instructions (Addendum)
It was a pleasure to see you today!  To summarize our discussion for this visit:  DM- start taking metformin twice per day. Follow up in 3 months to recheck your A1c to see how it is doing.   covid- please continue to rest during recovery. I have sent in an anxiety medication to your pharmacy. Please let us know if you need anything else. Your cough medicine should already be at your pharmacy but I would also recommend using honey.  Call or come back if you have any concerns or are not getting better.   Some additional health maintenance measures we should update are: Health Maintenance Due  Topic Date Due  . Hepatitis C Screening  Never done  . PNEUMOCOCCAL POLYSACCHARIDE VACCINE AGE 52-64 HIGH RISK  Never done  . FOOT EXAM  Never done  . OPHTHALMOLOGY EXAM  Never done  . URINE MICROALBUMIN  Never done  . COVID-19 Vaccine (1) Never done  . INFLUENZA VACCINE  Never done  .   Call the clinic at [phone] if your symptoms worsen or you have any concerns.   Thank you for allowing me to take part in your care,  Dr. Jamelle Rushing

## 2020-10-21 ENCOUNTER — Other Ambulatory Visit: Payer: Self-pay

## 2020-10-21 NOTE — Telephone Encounter (Signed)
Received returned phone call from daughter regarding rx.   Please advise if this can be sent in.   Veronda Prude, RN

## 2020-10-21 NOTE — Telephone Encounter (Signed)
Received phone call on nurse line from patient's daughter. Daughter reports that mother was instructed to continue tessalon for persistent cough. Mother is out of rx that she was previously discharged with.   Please advise if rx can be sent to the [facility] on [address].   Veronda Prude, RN

## 2020-10-22 MED ORDER — BENZONATATE 200 MG PO CAPS
200.0000 mg | ORAL_CAPSULE | Freq: Three times a day (TID) | ORAL | 0 refills | Status: DC
Start: 2020-10-22 — End: 2020-10-26

## 2020-10-26 ENCOUNTER — Other Ambulatory Visit: Payer: Self-pay | Admitting: Student in an Organized Health Care Education/Training Program
